# Patient Record
Sex: Female | Born: 1955 | Race: White | Hispanic: No | Marital: Married | Smoking: Former smoker
Health system: Southern US, Community
[De-identification: ages and names within clinical notes are randomized; demographics above are authoritative.]

## PROBLEM LIST (undated history)

## (undated) DIAGNOSIS — A499 Bacterial infection, unspecified: Secondary | ICD-10-CM

## (undated) DIAGNOSIS — F419 Anxiety disorder, unspecified: Secondary | ICD-10-CM

## (undated) DIAGNOSIS — N390 Urinary tract infection, site not specified: Secondary | ICD-10-CM

## (undated) DIAGNOSIS — R002 Palpitations: Secondary | ICD-10-CM

## (undated) DIAGNOSIS — E559 Vitamin D deficiency, unspecified: Secondary | ICD-10-CM

## (undated) DIAGNOSIS — D649 Anemia, unspecified: Secondary | ICD-10-CM

## (undated) DIAGNOSIS — M810 Age-related osteoporosis without current pathological fracture: Secondary | ICD-10-CM

## (undated) DIAGNOSIS — D72819 Decreased white blood cell count, unspecified: Secondary | ICD-10-CM

## (undated) DIAGNOSIS — R748 Abnormal levels of other serum enzymes: Secondary | ICD-10-CM

## (undated) DIAGNOSIS — R1011 Right upper quadrant pain: Secondary | ICD-10-CM

## (undated) DIAGNOSIS — I341 Nonrheumatic mitral (valve) prolapse: Secondary | ICD-10-CM

## (undated) HISTORY — DX: Age-related osteoporosis without current pathological fracture: M81.0

## (undated) HISTORY — DX: Urinary tract infection, site not specified: N39.0

## (undated) HISTORY — DX: Decreased white blood cell count, unspecified: D72.819

## (undated) HISTORY — DX: Anxiety disorder, unspecified: F41.9

## (undated) HISTORY — DX: Palpitations: R00.2

## (undated) HISTORY — DX: Bacterial infection, unspecified: A49.9

## (undated) HISTORY — DX: Abnormal levels of other serum enzymes: R74.8

## (undated) HISTORY — PX: LIVER BIOPSY: SHX301

## (undated) HISTORY — DX: Anemia, unspecified: D64.9

## (undated) HISTORY — DX: Vitamin D deficiency, unspecified: E55.9

## (undated) HISTORY — DX: Right upper quadrant pain: R10.11

## (undated) HISTORY — DX: Nonrheumatic mitral (valve) prolapse: I34.1

---

## 1998-03-14 ENCOUNTER — Other Ambulatory Visit: Admission: RE | Admit: 1998-03-14 | Discharge: 1998-03-14 | Payer: Self-pay | Admitting: Obstetrics and Gynecology

## 1999-05-06 ENCOUNTER — Other Ambulatory Visit: Admission: RE | Admit: 1999-05-06 | Discharge: 1999-05-06 | Payer: Self-pay | Admitting: Obstetrics and Gynecology

## 1999-05-07 ENCOUNTER — Other Ambulatory Visit: Admission: RE | Admit: 1999-05-07 | Discharge: 1999-05-07 | Payer: Self-pay | Admitting: Obstetrics and Gynecology

## 1999-05-07 ENCOUNTER — Encounter (INDEPENDENT_AMBULATORY_CARE_PROVIDER_SITE_OTHER): Payer: Self-pay | Admitting: Specialist

## 2000-09-29 ENCOUNTER — Other Ambulatory Visit: Admission: RE | Admit: 2000-09-29 | Discharge: 2000-09-29 | Payer: Self-pay | Admitting: Obstetrics and Gynecology

## 2000-10-08 ENCOUNTER — Encounter: Admission: RE | Admit: 2000-10-08 | Discharge: 2000-10-08 | Payer: Self-pay | Admitting: Obstetrics and Gynecology

## 2000-10-08 ENCOUNTER — Encounter: Payer: Self-pay | Admitting: Obstetrics and Gynecology

## 2001-10-12 ENCOUNTER — Encounter: Payer: Self-pay | Admitting: Obstetrics and Gynecology

## 2001-10-12 ENCOUNTER — Encounter: Admission: RE | Admit: 2001-10-12 | Discharge: 2001-10-12 | Payer: Self-pay | Admitting: Obstetrics and Gynecology

## 2002-01-24 ENCOUNTER — Ambulatory Visit (HOSPITAL_COMMUNITY): Admission: RE | Admit: 2002-01-24 | Discharge: 2002-01-24 | Payer: Self-pay | Admitting: Obstetrics and Gynecology

## 2002-01-24 ENCOUNTER — Encounter: Payer: Self-pay | Admitting: Obstetrics and Gynecology

## 2002-06-06 ENCOUNTER — Encounter (HOSPITAL_COMMUNITY): Payer: Self-pay | Admitting: Oncology

## 2002-06-06 ENCOUNTER — Ambulatory Visit (HOSPITAL_COMMUNITY): Admission: RE | Admit: 2002-06-06 | Discharge: 2002-06-06 | Payer: Self-pay | Admitting: Obstetrics and Gynecology

## 2002-10-18 ENCOUNTER — Encounter: Payer: Self-pay | Admitting: Obstetrics and Gynecology

## 2002-10-18 ENCOUNTER — Encounter: Admission: RE | Admit: 2002-10-18 | Discharge: 2002-10-18 | Payer: Self-pay | Admitting: Obstetrics and Gynecology

## 2003-10-20 ENCOUNTER — Encounter: Admission: RE | Admit: 2003-10-20 | Discharge: 2003-10-20 | Payer: Self-pay | Admitting: Obstetrics and Gynecology

## 2004-11-15 ENCOUNTER — Encounter: Admission: RE | Admit: 2004-11-15 | Discharge: 2004-11-15 | Payer: Self-pay | Admitting: Obstetrics and Gynecology

## 2006-02-04 ENCOUNTER — Encounter: Admission: RE | Admit: 2006-02-04 | Discharge: 2006-02-04 | Payer: Self-pay | Admitting: Obstetrics and Gynecology

## 2006-09-29 ENCOUNTER — Encounter: Admission: RE | Admit: 2006-09-29 | Discharge: 2006-09-29 | Payer: Self-pay | Admitting: Internal Medicine

## 2007-04-07 ENCOUNTER — Encounter: Admission: RE | Admit: 2007-04-07 | Discharge: 2007-04-07 | Payer: Self-pay | Admitting: Obstetrics and Gynecology

## 2008-03-14 ENCOUNTER — Encounter: Admission: RE | Admit: 2008-03-14 | Discharge: 2008-03-14 | Payer: Self-pay | Admitting: Internal Medicine

## 2008-04-10 ENCOUNTER — Encounter: Admission: RE | Admit: 2008-04-10 | Discharge: 2008-04-10 | Payer: Self-pay | Admitting: Obstetrics and Gynecology

## 2009-04-11 ENCOUNTER — Encounter: Admission: RE | Admit: 2009-04-11 | Discharge: 2009-04-11 | Payer: Self-pay | Admitting: Obstetrics and Gynecology

## 2010-04-22 ENCOUNTER — Encounter: Admission: RE | Admit: 2010-04-22 | Discharge: 2010-04-22 | Payer: Self-pay | Admitting: Obstetrics and Gynecology

## 2010-08-23 ENCOUNTER — Ambulatory Visit (HOSPITAL_COMMUNITY): Admission: RE | Admit: 2010-08-23 | Discharge: 2010-08-23 | Payer: Self-pay | Admitting: Surgery

## 2011-01-23 LAB — CBC
HCT: 37.5 % (ref 36.0–46.0)
Hemoglobin: 13 g/dL (ref 12.0–15.0)
MCH: 31.6 pg (ref 26.0–34.0)
MCHC: 34.8 g/dL (ref 30.0–36.0)
MCV: 90.9 fL (ref 78.0–100.0)
Platelets: 178 10*3/uL (ref 150–400)
RBC: 4.12 MIL/uL (ref 3.87–5.11)
RDW: 12.6 % (ref 11.5–15.5)
WBC: 3.6 10*3/uL — ABNORMAL LOW (ref 4.0–10.5)

## 2011-01-23 LAB — SURGICAL PCR SCREEN
MRSA, PCR: NEGATIVE
Staphylococcus aureus: POSITIVE — AB

## 2011-03-31 ENCOUNTER — Other Ambulatory Visit: Payer: Self-pay | Admitting: Obstetrics and Gynecology

## 2011-03-31 DIAGNOSIS — Z1231 Encounter for screening mammogram for malignant neoplasm of breast: Secondary | ICD-10-CM

## 2011-05-11 HISTORY — PX: CARDIAC CATHETERIZATION: SHX172

## 2011-05-12 ENCOUNTER — Ambulatory Visit: Payer: Self-pay

## 2011-05-20 ENCOUNTER — Ambulatory Visit
Admission: RE | Admit: 2011-05-20 | Discharge: 2011-05-20 | Disposition: A | Discharge status: Home / Self Care | Payer: BC Managed Care – PPO | Source: Ambulatory Visit | Attending: Obstetrics and Gynecology | Admitting: Obstetrics and Gynecology

## 2011-05-20 DIAGNOSIS — Z1231 Encounter for screening mammogram for malignant neoplasm of breast: Secondary | ICD-10-CM

## 2011-06-03 ENCOUNTER — Observation Stay (HOSPITAL_COMMUNITY)
Admission: EM | Admit: 2011-06-03 | Discharge: 2011-06-04 | DRG: 125 | Disposition: A | Discharge status: Home / Self Care | Payer: BC Managed Care – PPO | Attending: Cardiovascular Disease | Admitting: Cardiovascular Disease

## 2011-06-03 ENCOUNTER — Emergency Department (HOSPITAL_COMMUNITY): Payer: BC Managed Care – PPO

## 2011-06-03 DIAGNOSIS — Z8249 Family history of ischemic heart disease and other diseases of the circulatory system: Secondary | ICD-10-CM | POA: Insufficient documentation

## 2011-06-03 DIAGNOSIS — R002 Palpitations: Principal | ICD-10-CM | POA: Insufficient documentation

## 2011-06-03 DIAGNOSIS — R9431 Abnormal electrocardiogram [ECG] [EKG]: Secondary | ICD-10-CM | POA: Insufficient documentation

## 2011-06-03 DIAGNOSIS — R030 Elevated blood-pressure reading, without diagnosis of hypertension: Secondary | ICD-10-CM | POA: Insufficient documentation

## 2011-06-03 DIAGNOSIS — R000 Tachycardia, unspecified: Secondary | ICD-10-CM | POA: Insufficient documentation

## 2011-06-03 LAB — CARDIAC PANEL(CRET KIN+CKTOT+MB+TROPI)
CK, MB: 1.6 ng/mL (ref 0.3–4.0)
CK, MB: 1.4 ng/mL (ref 0.3–4.0)
Relative Index: INVALID (ref 0.0–2.5)
Relative Index: INVALID (ref 0.0–2.5)
Total CK: 81 U/L (ref 7–177)
Total CK: 71 U/L (ref 7–177)
Troponin I: <0.3 ng/mL (ref <0.30)
Troponin I: <0.3 ng/mL (ref <0.30)

## 2011-06-03 LAB — LIPID PANEL
Cholesterol: 191 mg/dL (ref 0–200)
HDL: 81 mg/dL (ref >39)
LDL Cholesterol: 98 mg/dL (ref 0–99)
Total CHOL/HDL Ratio: 2.4 RATIO
Triglycerides: 60 mg/dL (ref <150)
VLDL: 12 mg/dL (ref 0–40)

## 2011-06-03 LAB — DIFFERENTIAL
Basophils Absolute: 0 10*3/uL (ref 0.0–0.1)
Basophils Relative: 1 % (ref 0–1)
Eosinophils Absolute: 0.1 10*3/uL (ref 0.0–0.7)
Eosinophils Relative: 3 % (ref 0–5)
Lymphocytes Relative: 33 % (ref 12–46)
Lymphs Abs: 1.8 10*3/uL (ref 0.7–4.0)
Monocytes Absolute: 0.6 10*3/uL (ref 0.1–1.0)
Monocytes Relative: 12 % (ref 3–12)
Neutro Abs: 2.8 10*3/uL (ref 1.7–7.7)
Neutrophils Relative %: 52 % (ref 43–77)

## 2011-06-03 LAB — POCT I-STAT, CHEM 8
BUN: 18 mg/dL (ref 6–23)
Calcium, Ion: 1.14 mmol/L (ref 1.12–1.32)
Chloride: 109 mEq/L (ref 96–112)
Creatinine, Ser: 0.7 mg/dL (ref 0.50–1.10)
Glucose, Bld: 105 mg/dL — ABNORMAL HIGH (ref 70–99)
HCT: 39 % (ref 36.0–46.0)
Hemoglobin: 13.3 g/dL (ref 12.0–15.0)
Potassium: 3.5 mEq/L (ref 3.5–5.1)
Sodium: 143 mEq/L (ref 135–145)
TCO2: 24 mmol/L (ref 0–100)

## 2011-06-03 LAB — CBC
HCT: 38.2 % (ref 36.0–46.0)
Hemoglobin: 13.1 g/dL (ref 12.0–15.0)
MCH: 30.8 pg (ref 26.0–34.0)
MCHC: 34.3 g/dL (ref 30.0–36.0)
MCV: 89.9 fL (ref 78.0–100.0)
Platelets: 155 10*3/uL (ref 150–400)
RBC: 4.25 MIL/uL (ref 3.87–5.11)
RDW: 12.4 % (ref 11.5–15.5)
WBC: 5.4 10*3/uL (ref 4.0–10.5)

## 2011-06-03 LAB — HEMOGLOBIN A1C
Hgb A1c MFr Bld: 5.3 % (ref <5.7)
Mean Plasma Glucose: 105 mg/dL (ref <117)

## 2011-06-03 LAB — MAGNESIUM: Magnesium: 2.2 mg/dL (ref 1.5–2.5)

## 2011-06-03 LAB — TSH: TSH: 3.5 u[IU]/mL (ref 0.350–4.500)

## 2011-06-03 LAB — TROPONIN I: Troponin I: <0.3 ng/mL (ref <0.30)

## 2011-06-04 LAB — CBC
HCT: 36.8 % (ref 36.0–46.0)
Hemoglobin: 12.3 g/dL (ref 12.0–15.0)
MCH: 30.3 pg (ref 26.0–34.0)
MCHC: 33.4 g/dL (ref 30.0–36.0)
MCV: 90.6 fL (ref 78.0–100.0)
Platelets: 169 10*3/uL (ref 150–400)
RBC: 4.06 MIL/uL (ref 3.87–5.11)
RDW: 12.5 % (ref 11.5–15.5)
WBC: 4.8 10*3/uL (ref 4.0–10.5)

## 2011-06-04 LAB — BASIC METABOLIC PANEL
BUN: 13 mg/dL (ref 6–23)
CO2: 25 mEq/L (ref 19–32)
Calcium: 9.4 mg/dL (ref 8.4–10.5)
Chloride: 108 mEq/L (ref 96–112)
Creatinine, Ser: 0.57 mg/dL (ref 0.50–1.10)
GFR calc Af Amer: >60 mL/min (ref >60)
GFR calc non Af Amer: >60 mL/min (ref >60)
Glucose, Bld: 97 mg/dL (ref 70–99)
Potassium: 3.8 mEq/L (ref 3.5–5.1)
Sodium: 140 mEq/L (ref 135–145)

## 2011-06-04 NOTE — Cardiovascular Report (Signed)
Linda Noble, RADFORD NO.:  [id number]  MEDICAL RECORD NO.:  [id number]  LOCATION:  2006                         FACILITY:  [facility]  PHYSICIAN:  Thereasa Solo. Perfecto Purdy, M.D. DATE OF BIRTH:  07/17/1956  DATE OF PROCEDURE:  06/03/2011 DATE OF DISCHARGE:                           CARDIAC CATHETERIZATION   PROCEDURE:  Cardiac catheterization.  OPERATOR:  Thereasa Solo. Katriana Dortch, MD  INDICATIONS FOR TEST:  This 55 year old female presented to [facility] emergency room after 2 hours of marked tachycardia that was associated with discomfort in her right ear.  The tachycardia resolved as she reached the emergency room and so did the ear discomfort.  Her cardiac markers were negative but her EKG shows ST abnormalities in the inferior lateral leads that are more prominent than her prior outpatient cardiograms from Dr. Carolee Rota office.  She is brought to the cath lab for cardiac catheterization.  She is currently in a sinus rhythm.  Of note, her blood pressure in the ER was as high as 180 systolic.  She does not have a history of hypertension.  PROCEDURE IN DETAIL:  After obtaining informed consent, the patient was prepped and draped in the usual sterile fashion exposing the right groin.  Following local anesthetic with 1% Xylocaine, the Seldinger technique was employed and a 5-French introducer sheath was placed in the right femoral artery.  Left and right coronary arteriography and ventriculography in the RAO projection was performed.  COMPLICATIONS:  None.  TOTAL CONTRAST USED:  70 mL.  EQUIPMENT:  5-French Judkins configuration catheters.  RESULTS:  Hemodynamic monitoring:  Her central aortic pressure was 129/73 and her left ventricular pressure was 130/0 with no gradient noted at the time of pullback.  The left ventricular end-diastolic pressure was 7.  Her pressure when she first presented to the cath lab was in the 160s, but after a milligram of Versed her  pressure normalized.  Ventriculography:  Ventriculography in the RAO projection using 25 mL of contrast at 12 mL per second showed normal LV systolic function with an ejection fraction excess of 60%.  PVCs were noted during the ventriculogram.  End-diastolic pressure was 7.  Coronary arteriography:  On fluoroscopy there was no calcification. 1. Left main.  Normal and bifurcated. 2. LAD.  The LAD extended down to the apex of the heart and gave rise     to a very large diagonal that paralleled the LAD, almost like a     twin LAD type system.  This system was free of disease. 3. Circumflex.  The circumflex had 3 OM vessels with minimal ostial     narrowing of OM #1 with remainder system being free of disease. 4. Right coronary artery.  The right coronary artery gave rise to a     PDA and a very small posterolateral vessel all of which was free of     disease.  CONCLUSION: 1. Normal left ventricular systolic function. 2. No occlusive coronary disease.  I empirically plan to start her on metoprolol 25 mg p.o. b.i.d., this was more for the tachycardia.  I will monitor her overnight and plan discharge in the morning.  If her palpitations reoccurred, we will need an event  monitor to try to document the ectopy and I will not start the metoprolol until she is ready for discharge.          ______________________________ Thereasa Solo. Keath Matera, M.D.     ABL/MEDQ  D:  06/03/2011  T:  06/03/2011  Job:  [id number]  cc:   Soyla Murphy. Renne Crigler, M.D.  Electronically Signed by Julieanne Manson M.D. on 06/04/2011 01:36:52 PM

## 2011-06-11 NOTE — Discharge Summary (Signed)
Linda Noble, PAT NO.:  [id number]  MEDICAL RECORD NO.:  [id number]  LOCATION:  2006                         FACILITY:  [facility]  PHYSICIAN:  Linda Noble, M.D.     DATE OF BIRTH:  [birth year]  DATE OF ADMISSION:  06/03/2011 DATE OF DISCHARGE:  06/04/2011                              DISCHARGE SUMMARY   DISCHARGE DIAGNOSES: 1. Palpitations, i.e. rapid irregular heart beat, resolved. 2. Abnormal EKG, new changes from old EKG and at this heart     catheterization with normal coronary arteries. 3. Family history of coronary disease with father dying from     myocardial infarction in his 34s. 4. Elevated blood pressure, improved.  DISCHARGE CONDITION:  Improved.  DISCHARGE MEDICATIONS:  See medication reconciliation sheet from [facility]. We only placed her on Lopressor 12.5 mg on a p.r.n. basis for rapid heart rate, otherwise continued her previous home medicines.  DISCHARGE INSTRUCTIONS: 1. 2-D echo is scheduled for June 13, 2011 at noon.  Follow up with     Dr. Tresa Noble on June 16, 2011 at 2:00 p.m. 2. Increase activity slowly.  May shower.  No lifting for 3 days.  No     driving for 2 days. 3. Heart-healthy diet. 4. Wash cath site with soap and water.  Call if any bleeding,     swelling, or drainage.  HISTORY OF PRESENT ILLNESS:  This is a 55 year old white female with no previous cardiac history presented to [facility] Emergency Room on June 03, 2011 with palpitations.  Symptoms began around 11:00 p.m. prior to admission and lasted until arrival in the emergency room at 1:00 a.m. The patient complained of rapid irregular heartbeat associated with right ear pain, right arm weakness.  EKG on arrival revealed sinus rhythm with ST depression inferolaterally.  Her palpitations were resolved by the time she arrived at the ER, but her right arm continued to feel weak.  She had no shortness of breath, nausea, vomiting, or diaphoresis.  She is normally active,  exercises regularly, and her only change in regular activities is some increased fatigue.  Initial blood pressure was normal on arrival, it was elevated at 180 systolic after talking with Cardiology, may have been secondary to stress.  The patient states she has palpitations in the past but nothing lasting as long as her admitting rapid heart rate.  She was seen by Dr. Clarene Noble and the cardiac cath was felt to be the best option at this point.  She underwent heart cath which had normal LV systolic function.  She did have occasional PVCs during procedure.  Coronary arteries were normal. By the next morning, she was stable ambulating without any complications and ready for discharge home with plans for outpatient echo.  Also, Dr. Clarene Noble feels if she has more tachycardia, then he would recommend event monitor.  LABORATORY DATA:  Hemoglobin 12.3, hematocrit 36.8, WBC 4.8, and platelets 169, these were the same as admission.  Chemistry:  Sodium 140, potassium 3.8, chloride 108, CO2 25, glucose 97, BUN 13, creatinine 0.57, calcium 9.4, and magnesium 2.2.  Glycohemoglobin 6.3.  Cardiac enzymes:  CK 71, MB 1.4, and troponin I less than 0.30 x2.  Total  cholesterol 191, triglycerides 60, HDL 81, and LDL 98.  TSH 3.5.  Chest x-ray, two-view:  No acute cardiopulmonary process.  EKG:  Sinus rhythm with ST depression inferolaterally.  Similar pattern is old but much deeper than previous.     Linda Noble. Linda Noble, N.P.   ______________________________ Linda Noble, M.D.    LRI/MEDQ  D:  06/04/2011  T:  06/04/2011  Job:  [id number]  cc:   Linda Noble. Linda Noble, M.D.  Electronically Signed by Linda Noble N.P. on 06/09/2011 03:10:33 PM Electronically Signed by Linda Noble M.D. on 06/11/2011 04:38:50 PM

## 2012-05-11 ENCOUNTER — Other Ambulatory Visit: Payer: Self-pay | Admitting: Obstetrics and Gynecology

## 2012-05-11 DIAGNOSIS — Z1231 Encounter for screening mammogram for malignant neoplasm of breast: Secondary | ICD-10-CM

## 2012-05-21 ENCOUNTER — Ambulatory Visit
Admission: RE | Admit: 2012-05-21 | Discharge: 2012-05-21 | Disposition: A | Discharge status: Home / Self Care | Payer: PRIVATE HEALTH INSURANCE | Source: Ambulatory Visit | Attending: Obstetrics and Gynecology | Admitting: Obstetrics and Gynecology

## 2012-05-21 DIAGNOSIS — Z1231 Encounter for screening mammogram for malignant neoplasm of breast: Secondary | ICD-10-CM

## 2013-04-13 ENCOUNTER — Other Ambulatory Visit: Payer: Self-pay

## 2013-04-13 DIAGNOSIS — Z1231 Encounter for screening mammogram for malignant neoplasm of breast: Secondary | ICD-10-CM

## 2013-05-23 ENCOUNTER — Ambulatory Visit
Admission: RE | Admit: 2013-05-23 | Discharge: 2013-05-23 | Disposition: A | Discharge status: Home / Self Care | Payer: BC Managed Care – PPO | Source: Ambulatory Visit

## 2013-05-23 DIAGNOSIS — Z1231 Encounter for screening mammogram for malignant neoplasm of breast: Secondary | ICD-10-CM

## 2013-07-19 ENCOUNTER — Other Ambulatory Visit: Payer: Self-pay | Admitting: *Deleted

## 2013-07-22 ENCOUNTER — Telehealth: Payer: Self-pay | Admitting: Cardiovascular Disease

## 2013-07-22 NOTE — Telephone Encounter (Signed)
Patient got rx from dr. Tresa Endo or dr. Clarene Duke for metoprolol to take prn.  She has never had to take any and now the rx is expired and pharmacy can't refill and  states patient needs to have [place] call in new rx. She is going on a trip and wants to take some with her as a precaution just in case shse needs it. [facility] on [address].

## 2013-07-22 NOTE — Telephone Encounter (Signed)
Returned call.  Pt informed message received and metoprolol cannot be refilled w/o an OV as she has not been seen since Aug. 2012.  Pt verbalized understanding and agreed w/ plan. Transferred to scheduling.

## 2013-07-27 ENCOUNTER — Ambulatory Visit (INDEPENDENT_AMBULATORY_CARE_PROVIDER_SITE_OTHER): Payer: BC Managed Care – PPO | Admitting: Cardiovascular Disease

## 2013-07-27 ENCOUNTER — Encounter: Payer: Self-pay | Admitting: Cardiovascular Disease

## 2013-07-27 VITALS — BP 112/80 | HR 62 | Ht 68.0 in | Wt 131.0 lb

## 2013-07-27 DIAGNOSIS — Z13220 Encounter for screening for lipoid disorders: Secondary | ICD-10-CM

## 2013-07-27 DIAGNOSIS — I341 Nonrheumatic mitral (valve) prolapse: Secondary | ICD-10-CM

## 2013-07-27 DIAGNOSIS — I059 Rheumatic mitral valve disease, unspecified: Secondary | ICD-10-CM

## 2013-07-27 DIAGNOSIS — R002 Palpitations: Secondary | ICD-10-CM

## 2013-07-27 MED ORDER — METOPROLOL TARTRATE 25 MG PO TABS: 25.0000 mg | ORAL_TABLET | ORAL | Status: DC | PRN

## 2013-07-27 NOTE — Progress Notes (Signed)
Patient ID: Linda Noble, female   DOB: 04-28-1956, 57 y.o.   MRN: [id number]     HPI: Linda Noble, is a 57 y.o. female presents to the office today for cardiology evaluation. I last saw her 2 years ago.  Linda Noble has a history of palpitations for many years which typically have been very short-lived. In July 2012 she experienced a more persistent episode of tachycardia palpitations lasted several hours. After tachycardia resolved she was noted to have T-wave changes V3 through V6 as well as inferiorly. At that time, she was seen by Dr. Clarene Duke and to HER-2 or ECG changes and vague discomfort radiating to her arm and ear cardiac catheterization was performed. This revealed normal systolic function and normal coronary arteries. An echo Doppler study at that time was not definitive for mitral valve prolapse but did show trace MR and trace TR trace AR and trace PR. Ejection fraction was 55%.  She had been given a prescription for metoprolol tartrate to 12 to take 12.5 mg on an as-needed basis. Takes when necessary alprazolam  Past several years she has continued to do well. She denies any restrictions in activity. She has not been bothered by palpitations and essentially has not taken the metoprolol. Recently, there has been some increased work-related stress. She also is planning to [place] for 2 weeks and 2 days. She realizes that her metoprolol prescription was expired. She presents now for evaluation. Over the past several weeks he has noticed slight increase in the palpitations due to this increased recent stress.  No past medical history on file.  No past surgical history on file.  No Known Allergies  Current Outpatient Prescriptions  Medication Sig Dispense Refill  . metoprolol tartrate (LOPRESSOR) 25 MG tablet Take 25 mg by mouth as needed.      Marland Kitchen XANAX 0.25 MG tablet Take 1 tablet by mouth as needed.       No current facility-administered medications for this visit.    History    Social History  . Marital Status: Married    Spouse Name: N/A    Number of Children: N/A  . Years of Education: N/A   Occupational History  . Not on file.   Social History Main Topics  . Smoking status: Former Games developer  . Smokeless tobacco: Never Used     Comment: quit about 20 years ago  . Alcohol Use: 3 - 3.5 oz/week    6-7 drink(s) per week  . Drug Use: Not on file  . Sexual Activity: Not on file   Other Topics Concern  . Not on file   Social History Narrative  . No narrative on file    Family History  Problem Relation Age of Onset  . Heart disease Mother   . Heart attack Father     ROS is negative for fevers, chills or night sweats. She denies visual symptoms. She denies presyncope or syncope. She denies chest pain. She denies wheezing. She denies abdominal pain. She denies change in bowel or bladder habits. She denies claudication. She denies paresthesias. She denies tremors. She is unaware of thyroid abnormalities.  Other system review is negative.  PE BP 112/80  Pulse 62  Ht 5\' 8"  (1.727 m)  Wt 131 lb (59.421 kg)  BMI 19.92 kg/m2  General: Alert, oriented, no distress.  Skin: normal turgor, no rashes HEENT: Normocephalic, atraumatic. Pupils round and reactive; sclera anicteric;no lid lag.  Nose without nasal septal hypertrophy Mouth/Parynx benign; she does have  a high arched palate. Mallinpatti scale 2 Neck: No JVD, no carotid briuts Lungs: clear to ausculatation and percussion; no wheezing or rales Heart: RRR, s1 s2 normal; systolic click with a 2/6 systolic murmur at the lower sternal border but most pronounced pronounced at the apex suggestive of mitral valve prolapse with mitral regurgitation. Abdomen: soft, nontender; no hepatosplenomehaly, BS+; abdominal aorta nontender and not dilated by palpation. Pulses 2+ Extremities: no clubbing cyanosis or edema, Homan's sign negative  Neurologic: grossly nonfocal  ECG: Sinus rhythm at 62 beats per minute.  Grossly noted ST changes inferolaterally but slightly more pronounced T-wave inversion V3 V4 from previously.  LABS:  BMET    Component Value Date/Time   NA 140 06/04/2011 0420   K 3.8 06/04/2011 0420   CL 108 06/04/2011 0420   CO2 25 06/04/2011 0420   GLUCOSE 97 06/04/2011 0420   BUN 13 06/04/2011 0420   CREATININE 0.57 06/04/2011 0420   CALCIUM 9.4 06/04/2011 0420   GFRNONAA >60 06/04/2011 0420   GFRAA >60 06/04/2011 0420     Hepatic Function Panel  No results found for this basename: prot, albumin, ast, alt, alkphos, bilitot, bilidir, ibili     CBC    Component Value Date/Time   WBC 4.8 06/04/2011 0420   RBC 4.06 06/04/2011 0420   HGB 12.3 06/04/2011 0420   HCT 36.8 06/04/2011 0420   PLT 169 06/04/2011 0420   MCV 90.6 06/04/2011 0420   MCH 30.3 06/04/2011 0420   MCHC 33.4 06/04/2011 0420   RDW 12.5 06/04/2011 0420   LYMPHSABS 1.8 06/03/2011 0430   MONOABS 0.6 06/03/2011 0430   EOSABS 0.1 06/03/2011 0430   BASOSABS 0.0 06/03/2011 0430     BNP No results found for this basename: probnp    Lipid Panel     Component Value Date/Time   CHOL 191 06/03/2011 0915   TRIG 60 06/03/2011 0915   HDL 81 06/03/2011 0915   CHOLHDL 2.4 06/03/2011 0915   VLDL 12 06/03/2011 0915   LDLCALC 98 06/03/2011 0915     RADIOLOGY: No results found.    ASSESSMENT AND PLAN: Linda Noble is a 57 year old female who has a history of tachycardia palpitations. She does have nonspecific ST-T changes which have been present in 2012 and perhaps are slightly more prominent. Cardiac catheterization done 2 years ago revealed normal coronary arteries without obstructive disease. She denies any episodes of chest pain. She remains asymptomatic with the exception of recent slight increase in her long-standing palpitation history. She does admit to recent stress and it is possible that this most likely is catecholamine mediated. Physical exam does suggest mitral valve prolapse with mitral regurgitation. I did renew  her metoprolol tartrate to take 12.5-25 mg on a when necessary basis for palpitations if they recur and are significant. Upon her return from [place], I am suggesting that she undergo a two-year followup echo Doppler study. I'm also checking laboratory in a fasting state tomorrow before her trip. I will see her back in the office in approximately 2-3 months in followup of her echo Doppler to reassess her palpitation history. Significant increase palpitations recur monitoring will be undertaken at that time.     Lennette Bihari, MD, [facility]  07/27/2013 9:34 AM

## 2013-07-27 NOTE — Patient Instructions (Addendum)
Your physician has requested that you have an echocardiogram. Echocardiography is a painless test that uses sound waves to create images of your heart. It provides your doctor with information about the size and shape of your heart and how well your heart's chambers and valves are working. This procedure takes approximately one hour. There are no restrictions for this procedure.  Your physician recommends that you return for lab work.  Your physician recommends that you schedule a follow-up appointment in: 2-3 months

## 2013-07-28 LAB — LIPID PANEL
Cholesterol: 191 mg/dL (ref 0–200)
HDL: 72 mg/dL (ref >39)
LDL Cholesterol: 107 mg/dL — ABNORMAL HIGH (ref 0–99)
Total CHOL/HDL Ratio: 2.7 Ratio
Triglycerides: 61 mg/dL (ref <150)
VLDL: 12 mg/dL (ref 0–40)

## 2013-07-28 LAB — CBC
HCT: 40.7 % (ref 36.0–46.0)
Hemoglobin: 13.7 g/dL (ref 12.0–15.0)
MCH: 30.4 pg (ref 26.0–34.0)
MCHC: 33.7 g/dL (ref 30.0–36.0)
MCV: 90.2 fL (ref 78.0–100.0)
Platelets: 188 10*3/uL (ref 150–400)
RBC: 4.51 MIL/uL (ref 3.87–5.11)
RDW: 14 % (ref 11.5–15.5)
WBC: 3.5 10*3/uL — ABNORMAL LOW (ref 4.0–10.5)

## 2013-07-28 LAB — COMPREHENSIVE METABOLIC PANEL
ALT: 28 U/L (ref 0–35)
AST: 29 U/L (ref 0–37)
Albumin: 4.5 g/dL (ref 3.5–5.2)
Alkaline Phosphatase: 162 U/L — ABNORMAL HIGH (ref 39–117)
BUN: 19 mg/dL (ref 6–23)
CO2: 31 mEq/L (ref 19–32)
Calcium: 10.3 mg/dL (ref 8.4–10.5)
Chloride: 107 mEq/L (ref 96–112)
Creat: 0.67 mg/dL (ref 0.50–1.10)
Glucose, Bld: 89 mg/dL (ref 70–99)
Potassium: 4.8 mEq/L (ref 3.5–5.3)
Sodium: 142 mEq/L (ref 135–145)
Total Bilirubin: 0.7 mg/dL (ref 0.3–1.2)
Total Protein: 7.9 g/dL (ref 6.0–8.3)

## 2013-07-28 LAB — MAGNESIUM: Magnesium: 2.1 mg/dL (ref 1.5–2.5)

## 2013-07-28 LAB — TSH: TSH: 4.602 u[IU]/mL — ABNORMAL HIGH (ref 0.350–4.500)

## 2013-08-04 ENCOUNTER — Ambulatory Visit (HOSPITAL_COMMUNITY): Payer: BC Managed Care – PPO

## 2013-08-09 ENCOUNTER — Encounter: Payer: Self-pay | Admitting: *Deleted

## 2013-08-09 NOTE — Progress Notes (Signed)
Quick Note:  Lab note sent to patient. ______

## 2013-08-10 ENCOUNTER — Telehealth: Payer: Self-pay | Admitting: Cardiovascular Disease

## 2013-08-10 NOTE — Telephone Encounter (Signed)
Called patient to inform that Linda Noble, CMA had mailed lab results yesterday - should be expecting in mail. Informed patient over phone about cholesterol levels and electrolytes. Patient verbalized understanding. Patient asked that labs be sent to PCP - labs routed to Dr. Renne Crigler through [facility] fax.

## 2013-08-10 NOTE — Telephone Encounter (Signed)
Wanting lab results

## 2013-08-17 ENCOUNTER — Telehealth: Payer: Self-pay | Admitting: Cardiovascular Disease

## 2013-08-17 NOTE — Telephone Encounter (Signed)
Need her lab results faxed over to Dr Tracey Harries this morning asap.She have been waiting over a week for her results.Fax [phone] appt is today at 11.

## 2013-08-17 NOTE — Telephone Encounter (Signed)
Medical records sent information that was requested. Per Emi Belfast

## 2013-09-05 ENCOUNTER — Ambulatory Visit (HOSPITAL_COMMUNITY)
Admission: RE | Admit: 2013-09-05 | Discharge: 2013-09-05 | Disposition: A | Discharge status: Home / Self Care | Payer: BC Managed Care – PPO | Source: Ambulatory Visit | Attending: Internal Medicine | Admitting: Internal Medicine

## 2013-09-05 ENCOUNTER — Ambulatory Visit (HOSPITAL_COMMUNITY): Payer: BC Managed Care – PPO

## 2013-09-05 DIAGNOSIS — R002 Palpitations: Secondary | ICD-10-CM

## 2013-09-05 NOTE — Progress Notes (Signed)
2D Echo Performed 09/05/2013    Persais Ethridge, RCS  

## 2013-09-15 ENCOUNTER — Other Ambulatory Visit: Payer: Self-pay

## 2013-09-19 NOTE — Progress Notes (Signed)
Quick Note:  Results released into mychart. ______ 

## 2013-10-24 ENCOUNTER — Encounter: Payer: Self-pay | Admitting: Cardiovascular Disease

## 2013-10-24 ENCOUNTER — Ambulatory Visit (INDEPENDENT_AMBULATORY_CARE_PROVIDER_SITE_OTHER): Payer: BC Managed Care – PPO | Admitting: Cardiovascular Disease

## 2013-10-24 VITALS — BP 110/86 | HR 57 | Ht 68.0 in | Wt 131.9 lb

## 2013-10-24 DIAGNOSIS — I059 Rheumatic mitral valve disease, unspecified: Secondary | ICD-10-CM

## 2013-10-24 DIAGNOSIS — R002 Palpitations: Secondary | ICD-10-CM

## 2013-10-24 DIAGNOSIS — I340 Nonrheumatic mitral (valve) insufficiency: Secondary | ICD-10-CM | POA: Insufficient documentation

## 2013-10-24 NOTE — Patient Instructions (Signed)
Your physician recommends that you schedule a follow-up appointment in: 1 YEAR. No changes were made today in your therapy. 

## 2013-10-24 NOTE — Progress Notes (Signed)
Patient ID: [name], female   DOB: 1956/10/22, 57 y.o.   MRN: [id number]      HPI: Linda Noble, is a 57 y.o. female presents to the office today for cardiology evaluation.   Linda Noble has a history of palpitations for many years which typically have been very short-lived. In July 2012 she experienced a more persistent episode of tachypalpitations lasting several hours. After tachycardia resolved she was noted to have T-wave changes V3 through V6 as well as inferiorly. At that time, she was seen by Dr. Clarene Duke and to due to her ECG changes and vague discomfort radiating to her arm and ear cardiac catheterization was performed. This revealed normal systolic function and normal coronary arteries. An echo Doppler study at that time was not definitive for mitral valve prolapse but did show trace MR and trace TR trace AR and trace PR. Ejection fraction was 55%.  She had been given a prescription for metoprolol tartrate to 12 to take 12.5 mg on an as-needed basis and also takes when necessary alprazolam  Since I last saw Linda Noble in September she has continued to do well. She denies any restrictions in activity. She has not been bothered by palpitations and essentially has not taken the metoprolol. Recently, there has been some increased work-related stress.  She recently underwent a two-year followup echo Doppler study which was done on 09/05/2013. This showed an ejection fraction of 65-70%. Her mitral valve was mildly thickened without diagnostic prolapse on this study. She did have mild posteriorly directed mitral regurgitation. There also was mildly thickened tricuspid leaflet with trivial TR.  I also reviewed with her today the laboratory that she had done on 07/28/2013 she did have mild elevation of her alkaline phosphatase. She tells me several years ago she underwent a comprehensive evaluation for this including a liver biopsy at [facility] which was normal.  History reviewed. No  pertinent past medical history.  History reviewed. No pertinent past surgical history.  No Known Allergies  Current Outpatient Prescriptions  Medication Sig Dispense Refill  . metoprolol tartrate (LOPRESSOR) 25 MG tablet Take 1 tablet (25 mg total) by mouth as needed. Take 1/2-1 prn palpitations  30 tablet  3  . XANAX 0.25 MG tablet Take 1 tablet by mouth as needed.       No current facility-administered medications for this visit.    History   Social History  . Marital Status: Married    Spouse Name: N/A    Number of Children: N/A  . Years of Education: N/A   Occupational History  . Not on file.   Social History Main Topics  . Smoking status: Former Games developer  . Smokeless tobacco: Never Used     Comment: quit about 20 years ago  . Alcohol Use: 3 - 3.5 oz/week    6-7 drink(s) per week  . Drug Use: Not on file  . Sexual Activity: Not on file   Other Topics Concern  . Not on file   Social History Narrative  . No narrative on file    Family History  Problem Relation Age of Onset  . Heart disease Mother   . Heart attack Father     ROS is negative for fevers, chills or night sweats. She denies weight change. She denies rash She denies visual symptoms. She denies cough increased sputum production. She denies presyncope or syncope. She denies chest pain. She denies wheezing. She denies abdominal pain. She denies change in bowel or  bladder habits. She denies claudication. She denies paresthesias. She denies tremors. She is unaware of thyroid abnormalities.  She does have some difficulty with insomnia. She's unaware of sleep disordered breathing. Other comprehensive 14 port system review is negative.  PE BP 110/86  Pulse 57  Ht 5\' 8"  (1.727 m)  Wt 131 lb 14.4 oz (59.829 kg)  BMI 20.06 kg/m2  General: Alert, oriented, no distress.  Skin: normal turgor, no rashes HEENT: Normocephalic, atraumatic. Pupils round and reactive; sclera anicteric;no lid lag.  Nose without nasal  septal hypertrophy Mouth/Parynx benign; she does have a high arched palate. Mallinpatti scale 2 Neck: No JVD, no carotid bruitts Lungs: clear to ausculatation and percussion; no wheezing or rales Heart: RRR, s1 s2 normal; systolic click with a 1-2/6 systolic murmur at the lower sternal border but most pronounced pronounced at the apex suggestive of mitral valve prolapse with mitral regurgitation. Abdomen: soft, nontender; no hepatosplenomehaly, BS+; abdominal aorta nontender and not dilated by palpation. Pulses 2+ Extremities: no clubbing cyanosis or edema, Homan's sign negative  Neurologic: grossly nonfocal Psychological: Normal affect and mood.  ECG: Sinus rhythm at 57 beats per minute. Previously noted mild inferolateral ST-T changes. LABS:  BMET    Component Value Date/Time   NA 142 07/28/2013 0844   K 4.8 07/28/2013 0844   CL 107 07/28/2013 0844   CO2 31 07/28/2013 0844   GLUCOSE 89 07/28/2013 0844   BUN 19 07/28/2013 0844   CREATININE 0.67 07/28/2013 0844   CREATININE 0.57 06/04/2011 0420   CALCIUM 10.3 07/28/2013 0844   GFRNONAA >60 06/04/2011 0420   GFRAA >60 06/04/2011 0420     Hepatic Function Panel     Component Value Date/Time   PROT 7.9 07/28/2013 0844     CBC    Component Value Date/Time   WBC 3.5* 07/28/2013 0844   RBC 4.51 07/28/2013 0844   HGB 13.7 07/28/2013 0844   HCT 40.7 07/28/2013 0844   PLT 188 07/28/2013 0844   MCV 90.2 07/28/2013 0844   MCH 30.4 07/28/2013 0844   MCHC 33.7 07/28/2013 0844   RDW 14.0 07/28/2013 0844   LYMPHSABS 1.8 06/03/2011 0430   MONOABS 0.6 06/03/2011 0430   EOSABS 0.1 06/03/2011 0430   BASOSABS 0.0 06/03/2011 0430     BNP No results found for this basename: probnp    Lipid Panel     Component Value Date/Time   CHOL 191 07/28/2013 0844   TRIG 61 07/28/2013 0844   HDL 72 07/28/2013 0844   CHOLHDL 2.7 07/28/2013 0844   VLDL 12 07/28/2013 0844   LDLCALC 107* 07/28/2013 0844     RADIOLOGY: No results found.    ASSESSMENT AND  PLAN: Linda Noble is a 57 year old female who has a history of tachycardia palpitations and has not required use of metoprolol.  She does have nonspecific ST-T changes which have been present in 2012. Previous cardiac catheterization showed normal coronary arteries. Her most recent echo Doppler study confirms vigorous LV contractility. She did have mildly thickened valve and diagnostic prolapse was not demonstrated on this most recent echo. She did have a posteriorly directed jet of mitral regurgitation is suggestive of possible anterior leaflet prolapse which was not clearly discerned. She has not required use of her metoprolol. Negative for palpitations or stress mediated. Her blood pressure today is well controlled. Laboratory was reviewed. She is remaining stable. I will see her in one year for cardiology reassessment.   Lennette Bihari, MD, [facility]  10/24/2013 9:58  AM    

## 2014-05-08 ENCOUNTER — Other Ambulatory Visit: Payer: Self-pay

## 2014-05-08 DIAGNOSIS — Z1231 Encounter for screening mammogram for malignant neoplasm of breast: Secondary | ICD-10-CM

## 2014-05-31 ENCOUNTER — Ambulatory Visit
Admission: RE | Admit: 2014-05-31 | Discharge: 2014-05-31 | Disposition: A | Discharge status: Home / Self Care | Payer: Managed Care, Other (non HMO) | Source: Ambulatory Visit

## 2014-05-31 DIAGNOSIS — Z1231 Encounter for screening mammogram for malignant neoplasm of breast: Secondary | ICD-10-CM

## 2014-08-25 ENCOUNTER — Other Ambulatory Visit: Payer: Self-pay

## 2014-11-16 ENCOUNTER — Encounter: Payer: Self-pay | Admitting: Internal Medicine

## 2014-11-24 HISTORY — PX: OTHER SURGICAL HISTORY: SHX169

## 2015-01-19 ENCOUNTER — Encounter: Payer: Self-pay | Admitting: Internal Medicine

## 2015-01-19 ENCOUNTER — Ambulatory Visit (INDEPENDENT_AMBULATORY_CARE_PROVIDER_SITE_OTHER): Payer: Managed Care, Other (non HMO) | Admitting: Internal Medicine

## 2015-01-19 VITALS — BP 120/70 | HR 60 | Ht 67.75 in | Wt 131.5 lb

## 2015-01-19 DIAGNOSIS — R7989 Other specified abnormal findings of blood chemistry: Secondary | ICD-10-CM

## 2015-01-19 DIAGNOSIS — R945 Abnormal results of liver function studies: Secondary | ICD-10-CM

## 2015-01-19 DIAGNOSIS — Z1211 Encounter for screening for malignant neoplasm of colon: Secondary | ICD-10-CM

## 2015-01-19 DIAGNOSIS — R1011 Right upper quadrant pain: Secondary | ICD-10-CM

## 2015-01-19 MED ORDER — ALPRAZOLAM 0.25 MG PO TABS: 0.2500 mg | ORAL_TABLET | ORAL | Status: DC | PRN

## 2015-01-19 MED ORDER — MOVIPREP 100 G PO SOLR: 1.0000 | Freq: Once | ORAL | Status: DC

## 2015-01-19 NOTE — Progress Notes (Signed)
Linda Noble January 04, 1956 [id number]  Note: This dictation was prepared with Dragon digital system. Any transcriptional errors that result from this procedure are unintentional.   History of Present Illness: Referred by Dr Renne Crigler This is a 59 year old white female with chronic intermittent right upper quadrant abdominal pain which has bothered her for 15 or 20 years. It has become worse over Christmas but it has eased up in the last several weeks. The pain is exacerbated by change of position or in persistent sitting position at the computer and also at night by turning on her right side. It's not related to meals or bowel habits. No associated symptoms of nausea, vomiting, fever or jaundice. There is a history of mild elevation of alkaline phosphatase which was evaluatedat at  [facility] and included liver biopsy. No specific diagnosis was made. Last upper abdominal ultrasound  in 2009 showed normal 3.7 mm common bile duct, normal liver and gallbladder. Colonoscopy more than 10 years ago. There is no family history of colon cancer. Her bowel habits are regular. She had negative Hemoccults by Dr. Ambrose Mantle last fall.    Past Medical History  Diagnosis Date  . Anxiety   . Heart palpitations   . Mitral valve prolapse   . Anemia   . Urinary tract bacterial infections     Pt had in their 30's    Past Surgical History  Procedure Laterality Date  . Cardiac catheterization  July 2012  . Liver biopsy    . Eyelid surgery Bilateral 11/24/14    Eyelid was droopy    No Known Allergies  Family history and social history have been reviewed.  Review of Systems: Right upper quadrant discomfort. Negative for weight loss fever nausea or vomiting  The remainder of the 10 point ROS is negative except as outlined in the H&P  Physical Exam: General Appearance thin, in no distress, appearing younger than her stated age Eyes  Non icteric  HEENT  Non traumatic, normocephalic  Mouth No lesion,  tongue papillated, no cheilosis Neck Supple without adenopathy, thyroid not enlarged, no carotid bruits, no JVD Lungs Clear to auscultation bilaterally COR Normal S1, normal S2, regular rhythm, no murmur, quiet precordium Abdomen scaphoid. Nontender. Liver edge at costal margin. Ribs are not tender to palpation, pounding over the liver does not cause pain. Sitting up and laying down does not cause  pain. Laying on her right side does cause pain after about 1 minute. There is  no hernia. No bruit. I cannot feel any mass in her colon. No CVA tenderness Rectal not done Extremities  No pedal edema Skin No lesions Neurological Alert and oriented x 3 Psychological Normal mood and affect  Assessment and Plan:   59 year old white female with chronic intermittent right costal margin discomfort previously evaluated thought to be attributed to liver abnormalities but no liver pathology was found.. The nature of the discomfort is characteristic of musculoskeletal pain in that it is worse by changing position or sitting up and may be a result of the crowding or pressure of her viscera on the lower ribs. It also could be due to adhesions on the liver surface which would be hard to diagnose without laparoscopic exam. I cannot reproduce the pain on today's exam except when she lays on her right side. Pain may be associated with intermittent costochondritis. Trial of a nerve block at a intercoatal nerve would be a consideration.  Abnormal liver function tests previously evaluated with liver biopsy. I will obtain results of  the blood tests from Dr.Pharr, and and see if she had  gamma GGT to parrallel  elevation of alkaline phosphatase .  Colorectal screening. Last colonoscopy more than 10 years ago. We will schedule screening colonoscopy. Rule out redundant colon causing pressure on the rib cage   DR Lutricia FeilWalter Pharr  Linda Noble 01/19/2015

## 2015-01-19 NOTE — Patient Instructions (Addendum)
You have been scheduled for a colonoscopy. Please follow written instructions given to you at your visit today.  Please pick up your prep supplies at the pharmacy within the next 1-3 days. If you use inhalers (even only as needed), please bring them with you on the day of your procedure. Your physician has requested that you go to www.startemmi.com and enter the access code given to you at your visit today. This web site gives a general overview about your procedure. However, you should still follow specific instructions given to you by our office regarding your preparation for the procedure.   We have sent the following medications to your pharmacy for you to pick up at your convenience:  Xanax Dr Renne CriglerPharr

## 2015-01-31 ENCOUNTER — Encounter: Payer: Managed Care, Other (non HMO) | Admitting: Internal Medicine

## 2015-02-07 ENCOUNTER — Encounter: Payer: Managed Care, Other (non HMO) | Admitting: Internal Medicine

## 2015-04-18 ENCOUNTER — Encounter: Payer: Self-pay | Admitting: Internal Medicine

## 2015-04-18 ENCOUNTER — Ambulatory Visit (AMBULATORY_SURGERY_CENTER): Payer: Managed Care, Other (non HMO) | Admitting: Internal Medicine

## 2015-04-18 VITALS — BP 115/68 | HR 52 | Temp 98.6°F | Resp 18 | Ht 67.75 in | Wt 131.0 lb

## 2015-04-18 DIAGNOSIS — Z1211 Encounter for screening for malignant neoplasm of colon: Secondary | ICD-10-CM | POA: Diagnosis present

## 2015-04-18 MED ORDER — SODIUM CHLORIDE 0.9 % IV SOLN: 500.0000 mL | INTRAVENOUS | Status: DC

## 2015-04-18 NOTE — Progress Notes (Signed)
Pt stated that last colonoscopy wasn't completed d/t "twisty colon", had to have a barium enema.  Dr. Juanda ChanceBrodie made aware of this per A Mirts RN

## 2015-04-18 NOTE — Progress Notes (Signed)
A/ox3, pleased with MAC, report to RN 

## 2015-04-18 NOTE — Patient Instructions (Signed)
YOU HAD AN ENDOSCOPIC PROCEDURE TODAY AT THE [facility]:   Refer to the procedure report that was given to you for any specific questions about what was found during the examination.  If the procedure report does not answer your questions, please call your gastroenterologist to clarify.  If you requested that your care partner not be given the details of your procedure findings, then the procedure report has been included in a sealed envelope for you to review at your convenience later.  YOU SHOULD EXPECT: Some feelings of bloating in the abdomen. Passage of more gas than usual.  Walking can help get rid of the air that was put into your GI tract during the procedure and reduce the bloating. If you had a lower endoscopy (such as a colonoscopy or flexible sigmoidoscopy) you may notice spotting of blood in your stool or on the toilet paper. If you underwent a bowel prep for your procedure, you may not have a normal bowel movement for a few days.  Please Note:  You might notice some irritation and congestion in your nose or some drainage.  This is from the oxygen used during your procedure.  There is no need for concern and it should clear up in a day or so.  SYMPTOMS TO REPORT IMMEDIATELY:   Following lower endoscopy (colonoscopy or flexible sigmoidoscopy):  Excessive amounts of blood in the stool  Significant tenderness or worsening of abdominal pains  Swelling of the abdomen that is new, acute  Fever of 100F or higher  For urgent or emergent issues, a gastroenterologist can be reached at any hour by calling [phone].   DIET: Your first meal following the procedure should be a small meal and then it is ok to progress to your normal diet. Heavy or fried foods are harder to digest and may make you feel nauseous or bloated.  Likewise, meals heavy in dairy and vegetables can increase bloating.  Drink plenty of fluids but you should avoid alcoholic beverages for 24  hours.  ACTIVITY:  You should plan to take it easy for the rest of today and you should NOT DRIVE or use heavy machinery until tomorrow (because of the sedation medicines used during the test).    FOLLOW UP: Our staff will call the number listed on your records the next business day following your procedure to check on you and address any questions or concerns that you may have regarding the information given to you following your procedure. If we do not reach you, we will leave a message.  However, if you are feeling well and you are not experiencing any problems, there is no need to return our call.  We will assume that you have returned to your regular daily activities without incident.  If any biopsies were taken you will be contacted by phone or by letter within the next 1-3 weeks.  Please call us at [phone] if you have not heard about the biopsies in 3 weeks.    SIGNATURES/CONFIDENTIALITY: You and/or your care partner have signed paperwork which will be entered into your electronic medical record.  These signatures attest to the fact that that the information above on your After Visit Summary has been reviewed and is understood.  Full responsibility of the confidentiality of this discharge information lies with you and/or your care-partner.  Please review diverticulosis and high fiber diet handouts provided. Next colonoscopy in 10 years. 

## 2015-04-18 NOTE — Op Note (Signed)
[facility] [address] [place], [id number]   COLONOSCOPY PROCEDURE REPORT  PATIENT: Linda MiloMoore, Saidah E  MR#: [id number] BIRTHDATE: [birth year] , 58  yrs. old GENDER: female ENDOSCOPIST: Hart Carwinora M Daimion Adamcik, MD REFERRED XB:JYNWGNBY:Walter Terri Piedra Pharr, M.D. PROCEDURE DATE:  04/18/2015 PROCEDURE:   Colonoscopy, screening First Screening Colonoscopy - Avg.  risk and is 50 yrs.  old or older - No.  Prior Negative Screening - Now for repeat screening. 10 or more years since last screening  History of Adenoma - Now for follow-up colonoscopy & has been > or = to 3 yrs.  N/A  Polyps removed today? No Recommend repeat exam, <10 yrs? No ASA CLASS:   Class II INDICATIONS:Screening for colonic neoplasia, Colorectal Neoplasm Risk Assessment for this procedure is average risk, and prior colonoscopy elsewhere more than 10 years ago was incomplete. Patient had to have a barium enema, she has been having chronic right upper quadrant abdominal pain. MEDICATIONS: Monitored anesthesia care and Propofol 340 mg IV  DESCRIPTION OF PROCEDURE:   After the risks benefits and alternatives of the procedure were thoroughly explained, informed consent was obtained.  The digital rectal exam revealed no abnormalities of the rectum.   The LB PFC-H190 [id number]  endoscope was introduced through the anus and advanced to the cecum, which was identified by both the appendix and ileocecal valve. No adverse events experienced.   The quality of the prep was excellent. (MoviPrep was used)  The instrument was then slowly withdrawn as the colon was fully examined. Estimated blood loss is zero unless otherwise noted in this procedure report.      COLON FINDINGS: There was mild diverticulosis noted throughout the entire examined colon with associated tortuosity and angulation. Retroflexed views revealed no abnormalities. The time to cecum = 7.11 Withdrawal time = 5.37   The scope was withdrawn and the procedure  completed. COMPLICATIONS: There were no immediate complications.  ENDOSCOPIC IMPRESSION: There was mild diverticulosis noted throughout the entire examined colon  RECOMMENDATIONS: High-fiber diet Nothing to explain right upper quadrant abdominal pain recall colonoscopy in 10 years eSigned:  Hart Carwinora M Kleo Dungee, MD 04/18/2015 11:12 AM   cc:   PATIENT NAME:  Linda MiloMoore, Elowyn E MR#: [id number]

## 2015-04-19 ENCOUNTER — Telehealth: Payer: Self-pay | Admitting: *Deleted

## 2015-04-19 NOTE — Telephone Encounter (Signed)
  Follow up Call-  Call back number 04/18/2015  Post procedure Call Back phone  # [phone]  Permission to leave phone message Yes     Patient questions:  Do you have a fever, pain , or abdominal swelling? No. Pain Score  0 *  Have you tolerated food without any problems? Yes.    Have you been able to return to your normal activities? Yes.    Do you have any questions about your discharge instructions: Diet   No. Medications  No. Follow up visit  No.  Do you have questions or concerns about your Care? No.  Actions: * If pain score is 4 or above: No action needed, pain <4.

## 2015-05-09 ENCOUNTER — Other Ambulatory Visit: Payer: Self-pay

## 2015-05-09 DIAGNOSIS — Z1231 Encounter for screening mammogram for malignant neoplasm of breast: Secondary | ICD-10-CM

## 2015-06-12 ENCOUNTER — Encounter: Payer: Self-pay | Admitting: Cardiovascular Disease

## 2015-06-21 ENCOUNTER — Ambulatory Visit
Admission: RE | Admit: 2015-06-21 | Discharge: 2015-06-21 | Disposition: A | Discharge status: Home / Self Care | Payer: 59 | Source: Ambulatory Visit

## 2015-06-21 DIAGNOSIS — Z1231 Encounter for screening mammogram for malignant neoplasm of breast: Secondary | ICD-10-CM

## 2015-08-20 ENCOUNTER — Ambulatory Visit (INDEPENDENT_AMBULATORY_CARE_PROVIDER_SITE_OTHER): Payer: 59 | Admitting: Licensed Clinical Social Worker

## 2015-08-20 DIAGNOSIS — F4322 Adjustment disorder with anxiety: Secondary | ICD-10-CM | POA: Diagnosis not present

## 2015-08-22 ENCOUNTER — Ambulatory Visit: Payer: 59 | Admitting: Licensed Clinical Social Worker

## 2015-08-29 ENCOUNTER — Ambulatory Visit (INDEPENDENT_AMBULATORY_CARE_PROVIDER_SITE_OTHER): Payer: 59 | Admitting: Licensed Clinical Social Worker

## 2015-08-29 DIAGNOSIS — F4322 Adjustment disorder with anxiety: Secondary | ICD-10-CM | POA: Diagnosis not present

## 2015-09-03 ENCOUNTER — Ambulatory Visit (INDEPENDENT_AMBULATORY_CARE_PROVIDER_SITE_OTHER): Payer: 59 | Admitting: Licensed Clinical Social Worker

## 2015-09-03 DIAGNOSIS — F4322 Adjustment disorder with anxiety: Secondary | ICD-10-CM | POA: Diagnosis not present

## 2015-09-10 ENCOUNTER — Ambulatory Visit (INDEPENDENT_AMBULATORY_CARE_PROVIDER_SITE_OTHER): Payer: 59 | Admitting: Licensed Clinical Social Worker

## 2015-09-10 DIAGNOSIS — F4322 Adjustment disorder with anxiety: Secondary | ICD-10-CM

## 2015-09-20 ENCOUNTER — Ambulatory Visit (INDEPENDENT_AMBULATORY_CARE_PROVIDER_SITE_OTHER): Payer: 59 | Admitting: Licensed Clinical Social Worker

## 2015-09-20 ENCOUNTER — Ambulatory Visit: Payer: 59 | Admitting: Licensed Clinical Social Worker

## 2015-09-20 DIAGNOSIS — F4322 Adjustment disorder with anxiety: Secondary | ICD-10-CM | POA: Diagnosis not present

## 2015-09-26 ENCOUNTER — Ambulatory Visit (INDEPENDENT_AMBULATORY_CARE_PROVIDER_SITE_OTHER): Payer: 59 | Admitting: Licensed Clinical Social Worker

## 2015-09-26 DIAGNOSIS — F4322 Adjustment disorder with anxiety: Secondary | ICD-10-CM | POA: Diagnosis not present

## 2015-10-03 ENCOUNTER — Ambulatory Visit: Payer: 59 | Admitting: Licensed Clinical Social Worker

## 2015-10-09 ENCOUNTER — Ambulatory Visit (INDEPENDENT_AMBULATORY_CARE_PROVIDER_SITE_OTHER): Payer: 59 | Admitting: Licensed Clinical Social Worker

## 2015-10-09 DIAGNOSIS — F4322 Adjustment disorder with anxiety: Secondary | ICD-10-CM

## 2015-10-29 ENCOUNTER — Ambulatory Visit (INDEPENDENT_AMBULATORY_CARE_PROVIDER_SITE_OTHER): Payer: 59 | Admitting: Licensed Clinical Social Worker

## 2015-10-29 DIAGNOSIS — F4322 Adjustment disorder with anxiety: Secondary | ICD-10-CM

## 2015-11-14 ENCOUNTER — Ambulatory Visit: Payer: 59 | Admitting: Licensed Clinical Social Worker

## 2016-08-01 ENCOUNTER — Other Ambulatory Visit: Payer: Self-pay | Admitting: Obstetrics and Gynecology

## 2016-08-01 DIAGNOSIS — Z1231 Encounter for screening mammogram for malignant neoplasm of breast: Secondary | ICD-10-CM

## 2016-08-28 ENCOUNTER — Ambulatory Visit
Admission: RE | Admit: 2016-08-28 | Discharge: 2016-08-28 | Disposition: A | Discharge status: Home / Self Care | Payer: Managed Care, Other (non HMO) | Source: Ambulatory Visit | Attending: Obstetrics and Gynecology | Admitting: Obstetrics and Gynecology

## 2016-08-28 DIAGNOSIS — Z1231 Encounter for screening mammogram for malignant neoplasm of breast: Secondary | ICD-10-CM

## 2017-02-10 ENCOUNTER — Other Ambulatory Visit: Payer: Self-pay | Admitting: Internal Medicine

## 2017-02-10 DIAGNOSIS — E079 Disorder of thyroid, unspecified: Secondary | ICD-10-CM

## 2017-02-16 ENCOUNTER — Telehealth: Payer: Self-pay | Admitting: Neurology

## 2017-02-19 ENCOUNTER — Ambulatory Visit
Admission: RE | Admit: 2017-02-19 | Discharge: 2017-02-19 | Disposition: A | Discharge status: Home / Self Care | Payer: Managed Care, Other (non HMO) | Source: Ambulatory Visit | Attending: Internal Medicine | Admitting: Internal Medicine

## 2017-02-19 DIAGNOSIS — E079 Disorder of thyroid, unspecified: Secondary | ICD-10-CM

## 2017-03-23 DIAGNOSIS — E039 Hypothyroidism, unspecified: Secondary | ICD-10-CM | POA: Diagnosis not present

## 2017-03-23 DIAGNOSIS — D72819 Decreased white blood cell count, unspecified: Secondary | ICD-10-CM | POA: Diagnosis not present

## 2017-03-23 NOTE — Telephone Encounter (Signed)
na

## 2017-03-26 DIAGNOSIS — F5104 Psychophysiologic insomnia: Secondary | ICD-10-CM | POA: Diagnosis not present

## 2017-03-26 DIAGNOSIS — E079 Disorder of thyroid, unspecified: Secondary | ICD-10-CM | POA: Diagnosis not present

## 2017-03-26 DIAGNOSIS — R748 Abnormal levels of other serum enzymes: Secondary | ICD-10-CM | POA: Diagnosis not present

## 2017-04-14 DIAGNOSIS — L570 Actinic keratosis: Secondary | ICD-10-CM | POA: Diagnosis not present

## 2017-04-14 DIAGNOSIS — D1801 Hemangioma of skin and subcutaneous tissue: Secondary | ICD-10-CM | POA: Diagnosis not present

## 2017-04-14 DIAGNOSIS — L820 Inflamed seborrheic keratosis: Secondary | ICD-10-CM | POA: Diagnosis not present

## 2017-04-14 DIAGNOSIS — L821 Other seborrheic keratosis: Secondary | ICD-10-CM | POA: Diagnosis not present

## 2017-04-14 DIAGNOSIS — L814 Other melanin hyperpigmentation: Secondary | ICD-10-CM | POA: Diagnosis not present

## 2017-05-05 DIAGNOSIS — H1031 Unspecified acute conjunctivitis, right eye: Secondary | ICD-10-CM | POA: Diagnosis not present

## 2017-07-27 ENCOUNTER — Other Ambulatory Visit: Payer: Self-pay | Admitting: Obstetrics and Gynecology

## 2017-07-27 DIAGNOSIS — Z1231 Encounter for screening mammogram for malignant neoplasm of breast: Secondary | ICD-10-CM

## 2017-09-07 ENCOUNTER — Ambulatory Visit: Payer: Self-pay

## 2017-09-22 ENCOUNTER — Ambulatory Visit
Admission: RE | Admit: 2017-09-22 | Discharge: 2017-09-22 | Disposition: A | Discharge status: Home / Self Care | Payer: BLUE CROSS/BLUE SHIELD | Source: Ambulatory Visit | Attending: Obstetrics and Gynecology | Admitting: Obstetrics and Gynecology

## 2017-09-22 DIAGNOSIS — Z1231 Encounter for screening mammogram for malignant neoplasm of breast: Secondary | ICD-10-CM | POA: Diagnosis not present

## 2017-11-13 DIAGNOSIS — H1033 Unspecified acute conjunctivitis, bilateral: Secondary | ICD-10-CM | POA: Diagnosis not present

## 2017-11-16 DIAGNOSIS — H1033 Unspecified acute conjunctivitis, bilateral: Secondary | ICD-10-CM | POA: Diagnosis not present

## 2017-11-23 DIAGNOSIS — Z79890 Hormone replacement therapy (postmenopausal): Secondary | ICD-10-CM | POA: Diagnosis not present

## 2017-11-23 DIAGNOSIS — Z1389 Encounter for screening for other disorder: Secondary | ICD-10-CM | POA: Diagnosis not present

## 2017-11-23 DIAGNOSIS — Z130 Encounter for screening for diseases of the blood and blood-forming organs and certain disorders involving the immune mechanism: Secondary | ICD-10-CM | POA: Diagnosis not present

## 2017-11-23 DIAGNOSIS — Z6821 Body mass index (BMI) 21.0-21.9, adult: Secondary | ICD-10-CM | POA: Diagnosis not present

## 2017-11-23 DIAGNOSIS — Z01419 Encounter for gynecological examination (general) (routine) without abnormal findings: Secondary | ICD-10-CM | POA: Diagnosis not present

## 2017-12-04 DIAGNOSIS — H2513 Age-related nuclear cataract, bilateral: Secondary | ICD-10-CM | POA: Diagnosis not present

## 2017-12-04 DIAGNOSIS — H5203 Hypermetropia, bilateral: Secondary | ICD-10-CM | POA: Diagnosis not present

## 2017-12-04 DIAGNOSIS — H04123 Dry eye syndrome of bilateral lacrimal glands: Secondary | ICD-10-CM | POA: Diagnosis not present

## 2017-12-04 DIAGNOSIS — H524 Presbyopia: Secondary | ICD-10-CM | POA: Diagnosis not present

## 2017-12-14 DIAGNOSIS — J019 Acute sinusitis, unspecified: Secondary | ICD-10-CM | POA: Diagnosis not present

## 2018-02-15 DIAGNOSIS — E039 Hypothyroidism, unspecified: Secondary | ICD-10-CM | POA: Diagnosis not present

## 2018-02-15 DIAGNOSIS — Z0000 Encounter for general adult medical examination without abnormal findings: Secondary | ICD-10-CM | POA: Diagnosis not present

## 2018-02-15 DIAGNOSIS — N390 Urinary tract infection, site not specified: Secondary | ICD-10-CM | POA: Diagnosis not present

## 2018-02-15 DIAGNOSIS — E559 Vitamin D deficiency, unspecified: Secondary | ICD-10-CM | POA: Diagnosis not present

## 2018-02-18 DIAGNOSIS — Z0001 Encounter for general adult medical examination with abnormal findings: Secondary | ICD-10-CM | POA: Diagnosis not present

## 2018-02-18 DIAGNOSIS — M810 Age-related osteoporosis without current pathological fracture: Secondary | ICD-10-CM | POA: Diagnosis not present

## 2018-02-18 DIAGNOSIS — F5104 Psychophysiologic insomnia: Secondary | ICD-10-CM | POA: Diagnosis not present

## 2018-02-18 DIAGNOSIS — F411 Generalized anxiety disorder: Secondary | ICD-10-CM | POA: Diagnosis not present

## 2018-02-18 DIAGNOSIS — M2550 Pain in unspecified joint: Secondary | ICD-10-CM | POA: Diagnosis not present

## 2018-02-25 DIAGNOSIS — R1011 Right upper quadrant pain: Secondary | ICD-10-CM | POA: Diagnosis not present

## 2018-03-17 DIAGNOSIS — G2581 Restless legs syndrome: Secondary | ICD-10-CM | POA: Diagnosis not present

## 2018-03-17 DIAGNOSIS — E039 Hypothyroidism, unspecified: Secondary | ICD-10-CM | POA: Diagnosis not present

## 2018-03-17 DIAGNOSIS — F411 Generalized anxiety disorder: Secondary | ICD-10-CM | POA: Diagnosis not present

## 2018-03-17 DIAGNOSIS — F5104 Psychophysiologic insomnia: Secondary | ICD-10-CM | POA: Diagnosis not present

## 2018-04-15 DIAGNOSIS — L821 Other seborrheic keratosis: Secondary | ICD-10-CM | POA: Diagnosis not present

## 2018-04-15 DIAGNOSIS — D1801 Hemangioma of skin and subcutaneous tissue: Secondary | ICD-10-CM | POA: Diagnosis not present

## 2018-04-15 DIAGNOSIS — L814 Other melanin hyperpigmentation: Secondary | ICD-10-CM | POA: Diagnosis not present

## 2018-04-15 DIAGNOSIS — D225 Melanocytic nevi of trunk: Secondary | ICD-10-CM | POA: Diagnosis not present

## 2018-05-11 ENCOUNTER — Institutional Professional Consult (permissible substitution): Payer: Self-pay | Admitting: Neurology

## 2018-05-31 ENCOUNTER — Encounter: Payer: Self-pay | Admitting: Neurology

## 2018-05-31 ENCOUNTER — Ambulatory Visit: Payer: BLUE CROSS/BLUE SHIELD | Admitting: Neurology

## 2018-05-31 VITALS — BP 142/86 | HR 56 | Ht 68.0 in | Wt 139.0 lb

## 2018-05-31 DIAGNOSIS — G2581 Restless legs syndrome: Secondary | ICD-10-CM

## 2018-05-31 DIAGNOSIS — G479 Sleep disorder, unspecified: Secondary | ICD-10-CM | POA: Diagnosis not present

## 2018-05-31 DIAGNOSIS — R0683 Snoring: Secondary | ICD-10-CM

## 2018-05-31 DIAGNOSIS — G4761 Periodic limb movement disorder: Secondary | ICD-10-CM

## 2018-05-31 DIAGNOSIS — R351 Nocturia: Secondary | ICD-10-CM

## 2018-05-31 NOTE — Patient Instructions (Addendum)
Based on your symptoms and your exam I believe we should look for an underlying organic sleep disorder, such as obstructive sleep apnea/OSA, or dream enactments, or periodic leg movement disorder (PLMD), which is leg twitching in sleep. This condition, PLMD, is typically associated with symptoms of restless leg syndrome, but patients can have periodic leg movements, that is, repetitive twitching in their sleep secondary to medication effects, particularly from taking an SSRI type antidepressant. Sometimes changing the antidepressant regimen can help. If this leg movement disorder disrupts your sleep, it can cause other problems such as daytime sleepiness. Please do not drive if you feel sleepy. Sometimes other conditions can cause leg twitching at night including thyroid dysfunction and iron deficiency. There are some symptomatic medications available for treatment. At this point, we should proceed with a sleep study to further delineate your sleep-related issues.   If you have obstructive sleep apnea, OSA, I want you to consider treatment with CPAP. Please remember, the risks and ramifications of moderate to severe obstructive sleep apnea or OSA are: Cardiovascular disease, including congestive heart failure, stroke, difficult to control hypertension, arrhythmias, and even type 2 diabetes has been linked to untreated OSA. Sleep apnea causes disruption of sleep and sleep deprivation in most cases, which, in turn, can cause recurrent headaches, problems with memory, mood, concentration, focus, and vigilance. Most people with untreated sleep apnea report excessive daytime sleepiness, which can affect their ability to drive. Please do not drive if you feel sleepy.   I plan to see you back after your sleep study to go over the test results and where to go from there. You can continue your medications the same way for now.   We will call you after your sleep study to advise about the results (most likely, you will  hear from RoyaltonKristen, my nurse) and to set up an appointment at the time.    Our sleep lab administrative assistant will call you to schedule your sleep study. If you don't hear back from her by about 2 weeks from now, please feel free to call her at [phone]. This is her direct line and please leave a message with your phone number to call back if you get the voicemail box. She will call back as soon as possible.   Please do not drink wine close to bedtime, as alcohol and your medications at night don't mix well and alcohol is a sleep disrupter.   Your chronic insomnia ultimately will be best treated with cognitive behavioral therapy (CBT-I).

## 2018-05-31 NOTE — Progress Notes (Signed)
Subjective:    Patient ID: Linda Noble is a 62 y.o. female.  HPI     Huston FoleySaima Aanika Defoor, MD, PhD [facility] [address], [address] [address] [place], [place] [id number]  Dear Dr. Renne CriglerPharr,   I saw your patient, Linda Noble, upon your kind request, in my neurologic clinic today for initial consultation of her sleep disorder, in particular, her history of restless leg syndrome. The patient is unaccompanied today. As you know, Linda Noble is a 62 year old right-handed woman with an underlying medical history of osteoporosis, vitamin D deficiency, anxiety, and hypothyroidism, who reports a longstanding Hx of RLS since her 30s and 3940s. Sx were intermittent years ago, but got worse with time. Linda Noble moves in her sleep, wiggles her feet and at times has to get out of bed to move. Linda Noble has a FHx of RLS, including her father, brother, and sister. Her husband snores and sometimes thoughts sleep disruptive for her as Linda Noble is a light sleeper. Linda Noble does not like any noises in the room, does not have a TV in the bedroom and does not like a white noise machine. Linda Noble likes to read before falling asleep. Currently Linda Noble is on Lunesta 3 mg strength, half pill at night and takes 0.5 mg of ropinirole at bedtime. Linda Noble was advised to take a smaller dose around dinnertime but has not ventured out to do so yet. Linda Noble was tried about 3 months ago, on gabapentin, up to 600 mg at night, but had significant flareup of her restless legs and sleep difficulties. Linda Noble has chronic difficulty with sleep onset and sleep maintenance for years, since her 6340s. For her anxiety Linda Noble has been on Lexapro generic in the past, currently back on it at a low dose, 5 mg daily. Linda Noble does report stressors, but these improved lately. I reviewed your office note from 03/17/2018, which you kindly included. Her Epworth sleepiness score is 0 out of 24, fatigue score is 38 out of 63. Linda Noble is married and lives with her husband. Linda Noble currently does not  work, worked in [employer] for about 10 years. Linda Noble has 2 children, one son, one daughter, one 502 yo GD. Linda Noble quit smoking about 25 years ago. Linda Noble drinks alcohol in the form of wine, 2 glasses per day on average, caffeine in the form of coffee, 2 cups per day in the mornings. Tries to be in bed around 10, rise time between 7 and 8. Linda Noble snores mildly. Linda Noble has gained about 10 pounds in the past year or so.  Her Past Medical History Is Significant For: Past Medical History:  Diagnosis Date  . Anemia   . Anxiety   . Heart palpitations   . Leukopenia   . Mitral valve prolapse   . Osteoporosis   . RUQ pain   . Urinary tract bacterial infections    Pt had in their 30's  . Vitamin D deficiency     Her Past Surgical History Is Significant For: Past Surgical History:  Procedure Laterality Date  . CARDIAC CATHETERIZATION  July 2012  . Eyelid surgery Bilateral 11/24/14   Eyelid was droopy  . LIVER BIOPSY      Her Family History Is Significant For: Family History  Problem Relation Age of Onset  . Heart disease Mother   . Heart attack Father   . Hypertension Brother        x2  . Hypertension Sister   . Colon cancer Neg Hx   . Colon polyps  Neg Hx   . Diabetes Neg Hx   . Kidney disease Neg Hx   . Gallbladder disease Neg Hx   . Esophageal cancer Neg Hx   . Stomach cancer Neg Hx   . Rectal cancer Neg Hx     Her Social History Is Significant For: Social History   Socioeconomic History  . Marital status: Married    Spouse name: Not on file  . Number of children: 2  . Years of education: Not on file  . Highest education level: Not on file  Occupational History  . Occupation: Retired  Engineer, production  . Financial resource strain: Not on file  . Food insecurity:    Worry: Not on file    Inability: Not on file  . Transportation needs:    Medical: Not on file    Non-medical: Not on file  Tobacco Use  . Smoking status: Former Smoker    Types: Cigarettes    Last attempt to quit:  11/11/1979    Years since quitting: 38.5  . Smokeless tobacco: Never Used  . Tobacco comment: quit about 20 years ago  Substance and Sexual Activity  . Alcohol use: Yes    Alcohol/week: 3.6 - 4.2 oz    Types: 6 - 7 Standard drinks or equivalent per week    Comment: Occassionally  . Drug use: No  . Sexual activity: Not on file  Lifestyle  . Physical activity:    Days per week: Not on file    Minutes per session: Not on file  . Stress: Not on file  Relationships  . Social connections:    Talks on phone: Not on file    Gets together: Not on file    Attends religious service: Not on file    Active member of club or organization: Not on file    Attends meetings of clubs or organizations: Not on file    Relationship status: Not on file  Other Topics Concern  . Not on file  Social History Narrative  . Not on file    Her Allergies Are:  Allergies  Allergen Reactions  . Belsomra [Suvorexant]   :   Her Current Medications Are:  Outpatient Encounter Medications as of 05/31/2018  Medication Sig  . alendronate (FOSAMAX) 70 MG tablet Take 70 mg by mouth once a week. Take with a full glass of water on an empty stomach.  Marland Kitchen CALCIUM PO Take by mouth.  . Cholecalciferol (VITAMIN D PO) Take by mouth.  . escitalopram (LEXAPRO) 10 MG tablet Take 10 mg by mouth daily.  . Eszopiclone 3 MG TABS Take 1.5 mg by mouth at bedtime.   Marland Kitchen levothyroxine (SYNTHROID, LEVOTHROID) 75 MCG tablet TK 1 T PO QD  . rOPINIRole (REQUIP) 0.5 MG tablet Take 0.5 mg by mouth. 1 tablet 1.5 hours before bed and 1/2 tablet about 6 pm  . metoprolol tartrate (LOPRESSOR) 25 MG tablet Take 1 tablet (25 mg total) by mouth as needed. Take 1/2-1 prn palpitations (Patient not taking: Reported on 05/31/2018)  . [DISCONTINUED] ALPRAZolam (XANAX) 0.25 MG tablet Take 1 tablet (0.25 mg total) by mouth as needed.  . [DISCONTINUED] Calcium Carb-Cholecalciferol (CALCIUM + D3) 600-200 MG-UNIT TABS Take by mouth 2 (two) times daily.  .  [DISCONTINUED] Cholecalciferol (VITAMIN D3) 2000 units TABS Take by mouth.  . [DISCONTINUED] gabapentin (NEURONTIN) 300 MG capsule Take 300 mg by mouth at bedtime.  . [DISCONTINUED] VAGIFEM 10 MCG TABS vaginal tablet Take by mouth 2 (two) times  a week.    No facility-administered encounter medications on file as of 05/31/2018.   :  Review of Systems:  Out of a complete 14 point review of systems, all are reviewed and negative with the exception of these symptoms as listed below: Review of Systems  Neurological:       New patient to discuss not sleeping. Patient has not had a sleep study test done before and states Linda Noble has had a new onset of snoring.   Epworth Sleepiness Scale 0= would never doze 1= slight chance of dozing 2= moderate chance of dozing 3= high chance of dozing  Sitting and reading:0 Watching TV:0 Sitting inactive in a public place (ex. Theater or meeting):0 As a passenger in a car for an hour without a break:0 Lying down to rest in the afternoon:0 Sitting and talking to someone:0 Sitting quietly after lunch (no alcohol):0 In a car, while stopped in traffic:0 Total:0    Objective:  Neurological Exam  Physical Exam Physical Examination:   Vitals:   05/31/18 1527  BP: (!) 142/86  Pulse: (!) 56    General Examination: The patient is a very pleasant 62 y.o. female in no acute distress. Linda Noble appears well-developed and well-nourished and well groomed.   HEENT: Normocephalic, atraumatic, pupils are equal, round and reactive to light and accommodation. Extraocular tracking is good without limitation to gaze excursion or nystagmus noted. Normal smooth pursuit is noted. Hearing is grossly intact. Tympanic membranes are clear bilaterally. Face is symmetric with normal facial animation and normal facial sensation. Speech is clear with no dysarthria noted. There is no hypophonia. There is no lip, neck/head, jaw or voice tremor. Neck is supple with full range of passive and  active motion. There are no carotid bruits on auscultation. Oropharynx exam reveals: mild mouth dryness, adequate dental hygiene and no significant airway crowding. Mallampati is class I. Tongue protrudes centrally and palate elevates symmetrically. Tonsils are small. Neck size is 14 7/8 inches.    Chest: Clear to auscultation without wheezing, rhonchi or crackles noted.  Heart: S1+S2+0, regular and normal without murmurs, rubs or gallops noted.   Abdomen: Soft, non-tender and non-distended with normal bowel sounds appreciated on auscultation.  Extremities: There is no pitting edema in the distal lower extremities bilaterally.   Skin: Warm and dry without trophic changes noted.  Musculoskeletal: exam reveals no obvious joint deformities, tenderness or joint swelling or erythema.   Neurologically:  Mental status: The patient is awake, alert and oriented in all 4 spheres. Her immediate and remote memory, attention, language skills and fund of knowledge are appropriate. There is no evidence of aphasia, agnosia, apraxia or anomia. Speech is clear with normal prosody and enunciation. Thought process is linear. Mood is normal and affect is normal.  Cranial nerves II - XII are as described above under HEENT exam. In addition: shoulder shrug is normal with equal shoulder height noted. Motor exam: Normal bulk, strength and tone is noted. There is no drift, tremor or rebound. Romberg is negative. Reflexes are 2+. Fine motor skills and coordination: intact with normal finger taps, normal hand movements, normal rapid alternating patting, normal foot taps and normal foot agility.  Cerebellar testing: No dysmetria or intention tremor on finger to nose testing. Heel to shin is unremarkable bilaterally. There is no truncal or gait ataxia.  Sensory exam: intact to light touch in the upper and lower extremities.  Gait, station and balance: Linda Noble stands easily. No veering to one side is noted. No leaning  to one side  is noted. Posture is age-appropriate and stance is narrow based. Gait shows normal stride length and pace. No problems turning.               Assessment and Plan:  In summary, Linda Noble is a very pleasant 62 y.o.-year old female with an underlying medical history of osteoporosis, vitamin D deficiency, anxiety, and hypothyroidism, who presents for a sleep consultation for her sleep disturbance including restless leg syndrome, PLMS, chronic sleep onset and sleep maintenance difficulties. Linda Noble started snoring in the past year. For symptomatic treatment of her restless legs Linda Noble is on ropinirole 0.5 mg approximately 30 minutes before her bedtime currently. Linda Noble is also on low-dose Lunesta 1.5 mg each night. I do believe Linda Noble would benefit from a lab attended sleep study to further delineate her chronic sleep difficulties, particularly from the sleep apnea standpoint and PLMD. For her chronic sleep difficulties as far as sleep onset and sleep maintenance, Linda Noble may benefit from cognitive behavioral therapy through psychology practice. I had a long chat with the patient about my findings and the diagnosis of OSA and PLMD, the prognosis and treatment options.  I recommended the following at this time: sleep study with potential positive airway pressure titration. (We will score hypopneas at 3%).   I explained the sleep test procedure to the patient and also outlined possible surgical and non-surgical treatment options of OSA, including the use of CPAP. The patient indicated that Linda Noble would be willing to try CPAP if the need arises. I plan to see her back after the sleep study is completed and encouraged her to call with any interim questions, concerns, problems or updates. Linda Noble is advised to continue her current medication regimen. I answered all her questions today and Linda Noble was in agreement.  Thank you very much for allowing me to participate in the care of this nice patient. If I can be of any further assistance  to you please do not hesitate to call me at [phone].  Sincerely,   Huston Foley, MD, PhD

## 2018-06-03 ENCOUNTER — Telehealth: Payer: Self-pay

## 2018-06-03 DIAGNOSIS — G2581 Restless legs syndrome: Secondary | ICD-10-CM

## 2018-06-03 NOTE — Telephone Encounter (Signed)
Unfortunately, insurance has denied in lab sleep study. They are wanting to rule apnea out first with HST. Please place order for HST.  Then if it is negative we can bring in for in lab study for RLS. Thanks!!

## 2018-06-03 NOTE — Addendum Note (Signed)
Addended by: Geronimo RunningINKINS, Nhi Butrum A on: 06/03/2018 01:58 PM   Modules accepted: Orders

## 2018-06-03 NOTE — Telephone Encounter (Signed)
VO for HST from Dr. Athar received. HST order placed.  

## 2018-07-07 ENCOUNTER — Ambulatory Visit: Payer: BLUE CROSS/BLUE SHIELD | Admitting: Neurology

## 2018-07-07 DIAGNOSIS — G2581 Restless legs syndrome: Secondary | ICD-10-CM

## 2018-07-07 DIAGNOSIS — G479 Sleep disorder, unspecified: Secondary | ICD-10-CM

## 2018-07-07 DIAGNOSIS — G4733 Obstructive sleep apnea (adult) (pediatric): Secondary | ICD-10-CM

## 2018-07-14 ENCOUNTER — Telehealth: Payer: Self-pay

## 2018-07-14 NOTE — Telephone Encounter (Signed)
I called pt and discussed her sleep study results. Pt is agreeable to a follow up for RLS on 08/19/18 at 10:30am. Pt is asking for a sooner appt. I will call her when one becomes available. Pt verbalized understanding of results. Pt had no questions at this time but was encouraged to call back if questions arise.

## 2018-07-14 NOTE — Procedures (Signed)
[facility] [address] [place], [place] [address] NAME:  Linda Noble                                                                DOB: 11-15-1955 MEDICAL RECORD NUMBER [id number]                                                DOS:  07/07/18 REFERRING PHYSICIAN: Merri Brunette, MD STUDY PERFORMED: Home Sleep Test HISTORY: 62 year old woman with a history of osteoporosis, vitamin D deficiency, anxiety, and hypothyroidism, who reports a longstanding history of RLS since her 30s and 70s. Symptoms were intermittent years ago, but got worse with time. She has chronic difficulty with sleep onset and sleep maintenance for years, since her 48s. Her Epworth sleepiness score is 0 out of 24, fatigue score is 38 out of 63, BMI: 21.1.   STUDY RESULTS:  Total Recording Time: 7 hours, 7 minutes (valid test time: 3 hours, 57 min) Total Apnea/Hypopnea Index (AHI): 2.0/h, RDI: 5.4/h Average Oxygen Saturation: 94%, Lowest Oxygen Desaturation: 89%  Total Time Oxygen Saturation Below or at 88%: 0 minutes  Average Heart Rate: 50 bpm (between 43 and 78 bpm) IMPRESSION: Sleep disturbance, NOS RECOMMENDATION: This study does not demonstrate any significant obstructive or central sleep disordered breathing, minimal snoring was noted. Other causes, including circadian rhythm disturbances, an underlying mood disorder, medication effect and/or an underlying medical problem cannot be ruled out based on this test. The patient should be cautioned not to drive, work at heights, or operate dangerous or heavy equipment when tired or sleepy. Review and reiteration of good sleep hygiene measures should be pursued with any patient. Other causes of the patient's symptoms, including circadian rhythm disturbances, an underlying mood disorder, medication effect and/or an underlying medical problem cannot be ruled out based on this test. Clinical correlation is recommended. The patient and her referring  provider will be notified of the test results. The patient will be seen in follow up in sleep clinic at [facility], if needed. I certify that I have reviewed the raw data recording prior to the issuance of this report in accordance with the standards of the American Academy of Sleep Medicine (AASM).    Huston Foley, MD, PhD [facility]) Diplomat, ABPN (Neurology and Sleep)

## 2018-07-14 NOTE — Progress Notes (Signed)
Patient referred by Dr. Renne Crigler, seen by me on 05/31/18, HST on 07/07/18.   Please call and notify the patient that the recent home sleep test did not show any significant obstructive sleep apnea. I can see patient in FU if desired for her RLS.   Huston Foley, MD, PhD [facility])

## 2018-07-14 NOTE — Telephone Encounter (Signed)
-----   Message from Huston Foley, MD sent at 07/14/2018  8:39 AM EDT ----- Patient referred by Dr. Renne Crigler, seen by me on 05/31/18, HST on 07/07/18.   Please call and notify the patient that the recent home sleep test did not show any significant obstructive sleep apnea. I can see patient in FU if desired for her RLS.   Huston Foley, MD, PhD [facility])

## 2018-07-21 NOTE — Telephone Encounter (Signed)
I called pt to offer her a sooner appt with Dr. Frances Furbish tomorrow at 9:30am. If pt calls back and this appt is still available, please offer her this appt.

## 2018-07-28 NOTE — Telephone Encounter (Signed)
I called pt and offered her 2 available appts on 08/02/18 but pt cannot do any of those. Pt is ok keeping her 08/19/18 because her next few weeks are very busy.

## 2018-08-10 ENCOUNTER — Other Ambulatory Visit: Payer: Self-pay | Admitting: Obstetrics and Gynecology

## 2018-08-10 DIAGNOSIS — Z1231 Encounter for screening mammogram for malignant neoplasm of breast: Secondary | ICD-10-CM

## 2018-08-19 ENCOUNTER — Ambulatory Visit: Payer: BLUE CROSS/BLUE SHIELD | Admitting: Neurology

## 2018-08-19 ENCOUNTER — Encounter: Payer: Self-pay | Admitting: Neurology

## 2018-08-19 VITALS — BP 123/79 | HR 62 | Ht 68.0 in | Wt 140.0 lb

## 2018-08-19 DIAGNOSIS — G2581 Restless legs syndrome: Secondary | ICD-10-CM | POA: Diagnosis not present

## 2018-08-19 NOTE — Patient Instructions (Signed)
I think you should continue with the Requip as is, 0.5 mg about 90-120 min before your bedtime.  Your home sleep test was benign, no evidence of sleep apnea.  I think you can follow up with Dr. Renne Crigler routinely. I would be happy to see you back as needed.

## 2018-08-19 NOTE — Progress Notes (Signed)
Subjective:    Patient ID: Linda Noble is a 62 y.o. female.  HPI     Interim history:   Linda Noble is a 62 year old right-handed woman with an underlying medical history of osteoporosis, vitamin D deficiency, anxiety, and hypothyroidism, who presents for follow up consultation of her RLS, after recent sleep testing. The patient is unaccompanied today. I first met her on 05/31/2018 at the request of her primary care physician, at which time she reported a history of restless legs for many years as well as family history of restless legs. She was on ropinirole. She had tried gabapentin but had side effects with it. She was advised to proceed with a sleep study. Her insurance denied a lab attended sleep study. She had a home sleep test on 07/07/2018 which showed no significant sleep disordered breathing, AHI of 2 per hour, average oxygen saturation of 94%, nadir of 89%.w Today, 08/19/2018: She reports that she does not always feel the need to take her 6 PM dose of requip  pill. She does take the 1 pill before her BT. She has insomnia, for which she has been on Lunesta generic 3 mg. Typically, she takes half a pill only on a nightly basis but lately she has had more difficulty and more stress, has taken a whole pill at times. The ropinirole is working reasonably well. She has tried that in the recent past about a year and half ago at which time she eventually felt it was no longer effective. She then tried gabapentin and had worsening of her symptoms. She has started yoga in the evenings which has helped with anxiety and stress reduction.  The patient's allergies, current medications, family history, past medical history, past social history, past surgical history and problem list were reviewed and updated as appropriate.   Previously:   05/31/2018: (She) reports a longstanding Hx of RLS since her 65s and 75s. Sx were intermittent years ago, but got worse with time. She moves in her sleep, wiggles  her feet and at times has to get out of bed to move. She has a FHx of RLS, including her father, brother, and sister. Her husband snores and sometimes thoughts sleep disruptive for her as she is a light sleeper. She does not like any noises in the room, does not have a TV in the bedroom and does not like a white noise machine. She likes to read before falling asleep. Currently she is on Lunesta 3 mg strength, half pill at night and takes 0.5 mg of ropinirole at bedtime. She was advised to take a smaller dose around dinnertime but has not ventured out to do so yet. She was tried about 3 months ago, on gabapentin, up to 600 mg at night, but had significant flareup of her restless legs and sleep difficulties. She has chronic difficulty with sleep onset and sleep maintenance for years, since her 60s. For her anxiety she has been on Lexapro generic in the past, currently back on it at a low dose, 5 mg daily. She does report stressors, but these improved lately. I reviewed your office note from 03/17/2018, which you kindly included. Her Epworth sleepiness score is 0 out of 24, fatigue score is 38 out of 63. She is married and lives with her husband. She currently does not work, worked in Pharmacologist for about 10 years. She has 2 children, one son, one daughter, one 53 yo GD. She quit smoking about 25 years ago. She drinks alcohol in the  form of wine, 2 glasses per day on average, caffeine in the form of coffee, 2 cups per day in the mornings. Tries to be in bed around 10, rise time between 7 and 8. She snores mildly. She has gained about 10 pounds in the past year or so.  Her Past Medical History Is Significant For: Past Medical History:  Diagnosis Date  . Anemia   . Anxiety   . Heart palpitations   . Leukopenia   . Mitral valve prolapse   . Osteoporosis   . RUQ pain   . Urinary tract bacterial infections    Pt had in their 30's  . Vitamin D deficiency     Her Past Surgical History Is Significant For: Past  Surgical History:  Procedure Laterality Date  . CARDIAC CATHETERIZATION  July 2012  . Eyelid surgery Bilateral 11/24/14   Eyelid was droopy  . LIVER BIOPSY      Her Family History Is Significant For: Family History  Problem Relation Age of Onset  . Heart disease Mother   . Heart attack Father   . Hypertension Brother        x2  . Hypertension Sister   . Colon cancer Neg Hx   . Colon polyps Neg Hx   . Diabetes Neg Hx   . Kidney disease Neg Hx   . Gallbladder disease Neg Hx   . Esophageal cancer Neg Hx   . Stomach cancer Neg Hx   . Rectal cancer Neg Hx     Her Social History Is Significant For: Social History   Socioeconomic History  . Marital status: Married    Spouse name: Not on file  . Number of children: 2  . Years of education: Not on file  . Highest education level: Not on file  Occupational History  . Occupation: Retired  Scientific laboratory technician  . Financial resource strain: Not on file  . Food insecurity:    Worry: Not on file    Inability: Not on file  . Transportation needs:    Medical: Not on file    Non-medical: Not on file  Tobacco Use  . Smoking status: Former Smoker    Types: Cigarettes    Last attempt to quit: 11/11/1979    Years since quitting: 38.7  . Smokeless tobacco: Never Used  . Tobacco comment: quit about 20 years ago  Substance and Sexual Activity  . Alcohol use: Yes    Alcohol/week: 6.0 - 7.0 standard drinks    Types: 6 - 7 Standard drinks or equivalent per week    Comment: Occassionally  . Drug use: No  . Sexual activity: Not on file  Lifestyle  . Physical activity:    Days per week: Not on file    Minutes per session: Not on file  . Stress: Not on file  Relationships  . Social connections:    Talks on phone: Not on file    Gets together: Not on file    Attends religious service: Not on file    Active member of club or organization: Not on file    Attends meetings of clubs or organizations: Not on file    Relationship status: Not on  file  Other Topics Concern  . Not on file  Social History Narrative  . Not on file    Her Allergies Are:  Allergies  Allergen Reactions  . Belsomra [Suvorexant]   . Sulfa Antibiotics   :   Her Current Medications Are:  Outpatient  Encounter Medications as of 08/19/2018  Medication Sig  . alendronate (FOSAMAX) 70 MG tablet Take 70 mg by mouth once a week. Take with a full glass of water on an empty stomach.  . escitalopram (LEXAPRO) 5 MG tablet Take 5 mg by mouth daily.  . Eszopiclone 3 MG TABS Take 1.5 mg by mouth at bedtime.   Marland Kitchen levothyroxine (SYNTHROID, LEVOTHROID) 75 MCG tablet TK 1 T PO QD  . metoprolol tartrate (LOPRESSOR) 25 MG tablet Take 1 tablet (25 mg total) by mouth as needed. Take 1/2-1 prn palpitations  . rOPINIRole (REQUIP) 0.5 MG tablet Take 0.5 mg by mouth. 1 tablet 1.5 hours before bed and 1/2 tablet about 6 pm  . CALCIUM PO Take by mouth.  . Cholecalciferol (VITAMIN D PO) Take by mouth.  . [DISCONTINUED] escitalopram (LEXAPRO) 10 MG tablet Take 10 mg by mouth daily.   No facility-administered encounter medications on file as of 08/19/2018.   :  Review of Systems:  Out of a complete 14 point review of systems, all are reviewed and negative with the exception of these symptoms as listed below:  Review of Systems  Neurological:       Pt presents today to discuss her RLS and sleep study results.    Objective:  Neurological Exam  Physical Exam Physical Examination:   Vitals:   08/19/18 1020  BP: 123/79  Pulse: 62    General Examination: The patient is a very pleasant 62 y.o. female in no acute distress. She appears well-developed and well-nourished and well groomed.   HEENT: Normocephalic, atraumatic, pupils are equal, round and reactive to light and accommodation. Extraocular tracking is good without limitation to gaze excursion or nystagmus noted. Normal smooth pursuit is noted. Hearing is grossly intact. Face is symmetric with normal facial animation  and normal facial sensation. Speech is clear with no dysarthria noted. There is no hypophonia. There is no lip, neck/head, jaw or voice tremor. Neck with FROM. There are no carotid bruits on auscultation. Oropharynx exam reveals: no obvious change.   Chest: Clear to auscultation without wheezing, rhonchi or crackles noted.  Heart: S1+S2+0, regular and normal without murmurs, rubs or gallops noted.   Abdomen: Soft, non-tender and non-distended with normal bowel sounds appreciated on auscultation.  Extremities: There is no pitting edema in the distal lower extremities bilaterally.   Skin: Warm and dry without trophic changes noted.  Musculoskeletal: exam reveals no obvious joint deformities, tenderness or joint swelling or erythema.   Neurologically:  Mental status: The patient is awake, alert and oriented in all 4 spheres. Her immediate and remote memory, attention, language skills and fund of knowledge are appropriate. There is no evidence of aphasia, agnosia, apraxia or anomia. Speech is clear with normal prosody and enunciation. Thought process is linear. Mood is normal and affect is normal.  Cranial nerves II - XII are as described above under HEENT exam.  Motor exam: Normal bulk, strength and tone is noted. There is no drift, tremor or rebound. Fine motor skills and coordination: grossly intact.  Cerebellar testing: No dysmetria or intention tremor. There is no truncal or gait ataxia.  Sensory exam: intact to light touch in the upper and lower extremities.  Gait, station and balance: She stands easily. No veering to one side is noted. No leaning to one side is noted. Posture is age-appropriate and stance is narrow based. Gait shows normal stride length and pace. No problems turning.  Assessment and Plan:  In summary, KEYANAH KOZICKI is a very pleasant 62 year old female with an underlying medical history of osteoporosis, vitamin D deficiency, anxiety, and  hypothyroidism, who presents for FU consultation for her sleep disturbance including restless leg syndrome, Hx of PLMS, and chronic sleep onset and sleep maintenance difficulties. She Had a home sleep test in August 2019 which was negative for any significant sleep disordered breathing. She has been on low-dose ropinirole 0.5 mg strength, she takes it about 30-60 minutes before her bedtime, she was encouraged by her primary care physician to take a half pill at 6 PM also. She typically does not take for 6 PM dose and is getting by with the nighttime dose only. She is encouraged to take it more closer to 90 minutes or even 120 minutes before her projected bedtime. She does take Lunesta 1.5 mg each night, up to 3 mg each night. We talked about the importance of good sleep hygiene and stress/anxiety reduction. She has been doing yoga for this which has been helpful. From my end of things she can follow-up on an as-needed basis. I answered all her questions today and she was in agreement. I spent 25 minutes in total face-to-face time with the patient, more than 50% of which was spent in counseling and coordination of care, reviewing test results, reviewing medication and discussing or reviewing the diagnosis of RLS,  its prognosis and treatment options. Pertinent laboratory and imaging test results that were available during this visit with the patient were reviewed by me and considered in my medical decision making (see chart for details).

## 2018-09-28 ENCOUNTER — Ambulatory Visit
Admission: RE | Admit: 2018-09-28 | Discharge: 2018-09-28 | Disposition: A | Discharge status: Home / Self Care | Payer: BLUE CROSS/BLUE SHIELD | Source: Ambulatory Visit | Attending: Obstetrics and Gynecology | Admitting: Obstetrics and Gynecology

## 2018-09-28 DIAGNOSIS — Z1231 Encounter for screening mammogram for malignant neoplasm of breast: Secondary | ICD-10-CM | POA: Diagnosis not present

## 2018-10-11 DIAGNOSIS — M810 Age-related osteoporosis without current pathological fracture: Secondary | ICD-10-CM | POA: Diagnosis not present

## 2018-10-11 DIAGNOSIS — E039 Hypothyroidism, unspecified: Secondary | ICD-10-CM | POA: Diagnosis not present

## 2018-10-11 DIAGNOSIS — E559 Vitamin D deficiency, unspecified: Secondary | ICD-10-CM | POA: Diagnosis not present

## 2018-10-18 DIAGNOSIS — M810 Age-related osteoporosis without current pathological fracture: Secondary | ICD-10-CM | POA: Diagnosis not present

## 2018-10-18 DIAGNOSIS — E079 Disorder of thyroid, unspecified: Secondary | ICD-10-CM | POA: Diagnosis not present

## 2018-10-18 DIAGNOSIS — E039 Hypothyroidism, unspecified: Secondary | ICD-10-CM | POA: Diagnosis not present

## 2018-10-18 DIAGNOSIS — E559 Vitamin D deficiency, unspecified: Secondary | ICD-10-CM | POA: Diagnosis not present

## 2018-12-01 DIAGNOSIS — E039 Hypothyroidism, unspecified: Secondary | ICD-10-CM | POA: Diagnosis not present

## 2018-12-14 IMAGING — MG DIGITAL SCREENING BILATERAL MAMMOGRAM WITH TOMO AND CAD
8 series · 9 of 24 positions shown · non-contrast
Comparison: Previous exam(s).

CLINICAL DATA: Screening.

EXAM:
DIGITAL SCREENING BILATERAL MAMMOGRAM WITH TOMO AND CAD

[R MLO synth-2D]
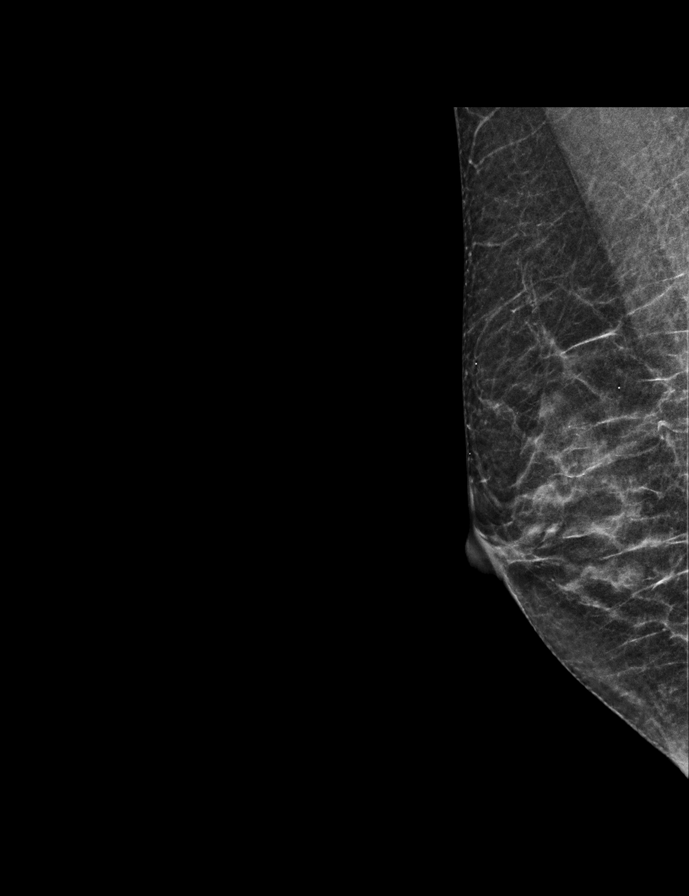

[L MLO synth-2D]
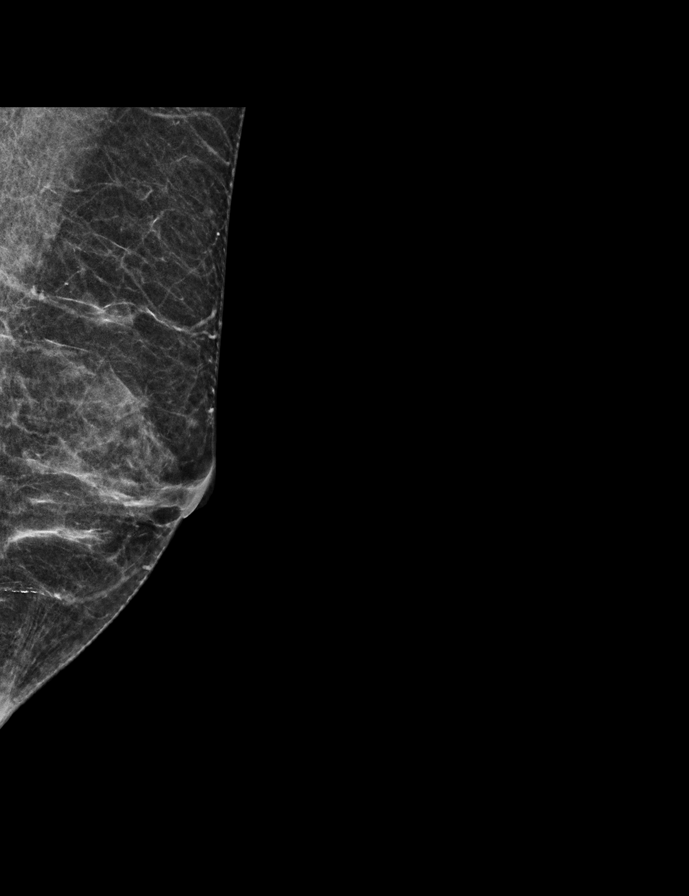

[L CC synth-2D]
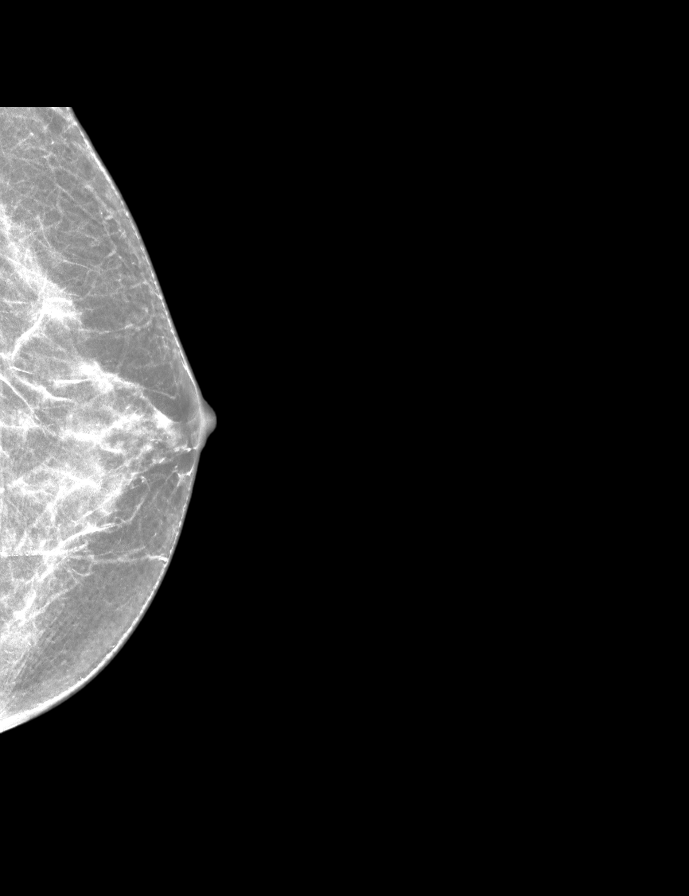

[R CC synth-2D]
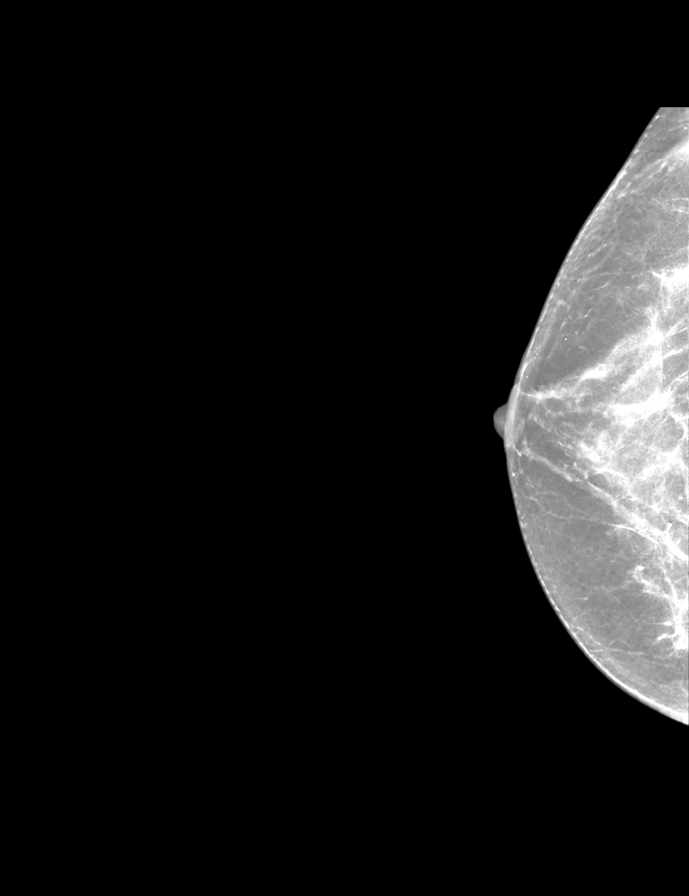

[R CC tomo · 2 of 57 frames shown]
[frame 19/57]
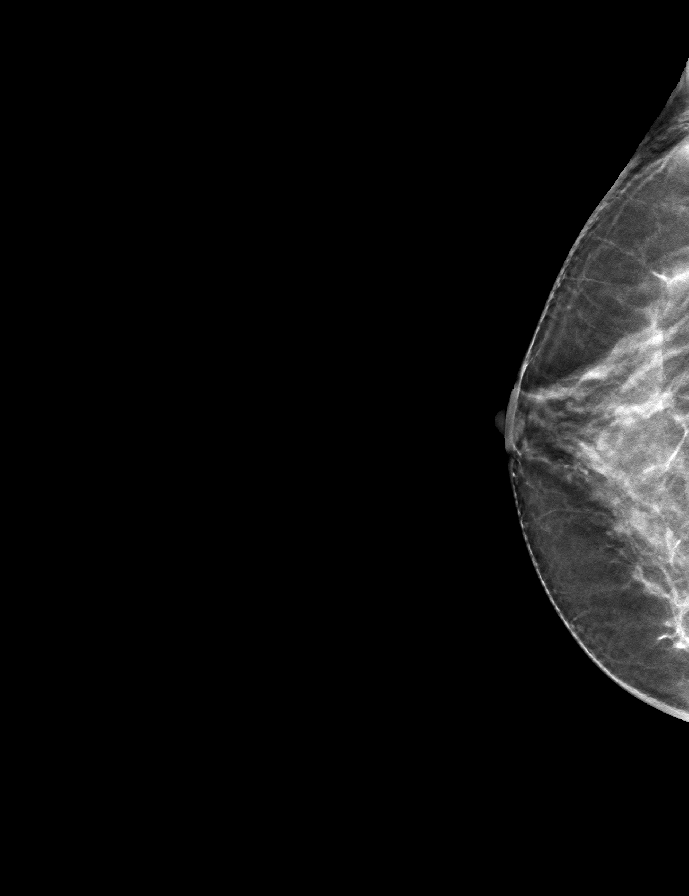
[frame 29/57]
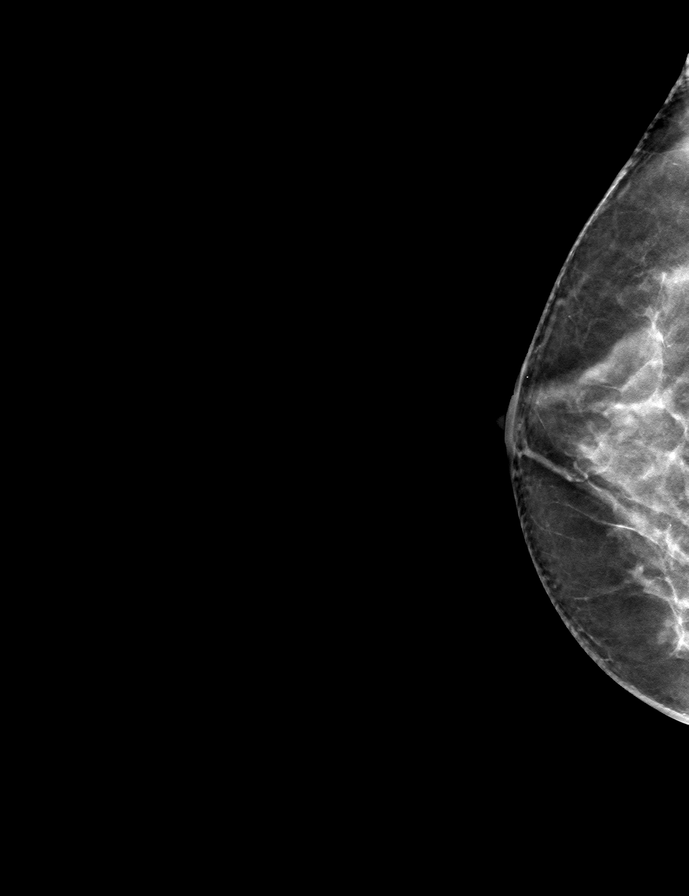

[R MLO tomo · tomo slice 23/45.0]
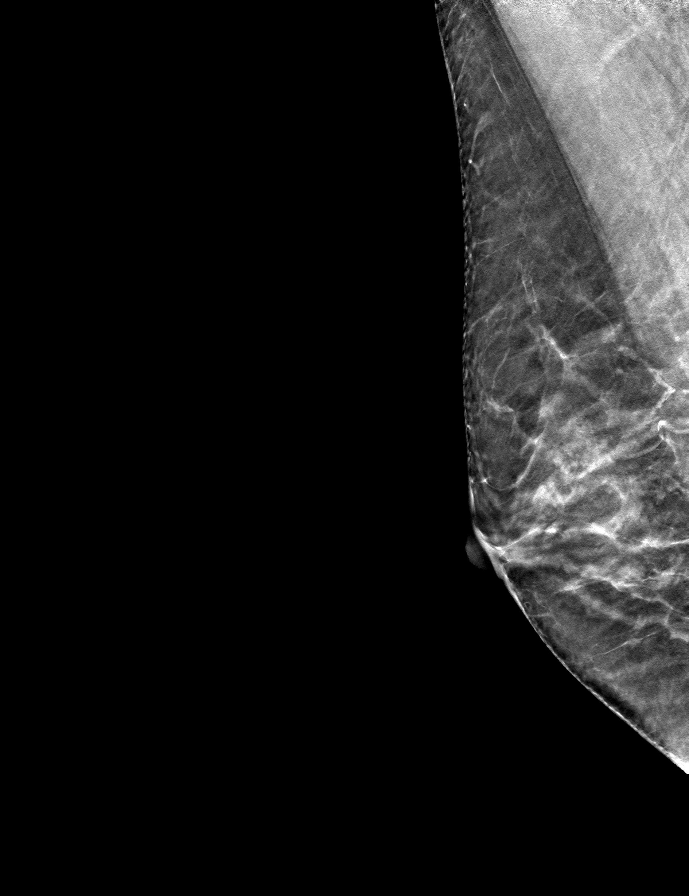

[L CC tomo · tomo slice 29/57.0]
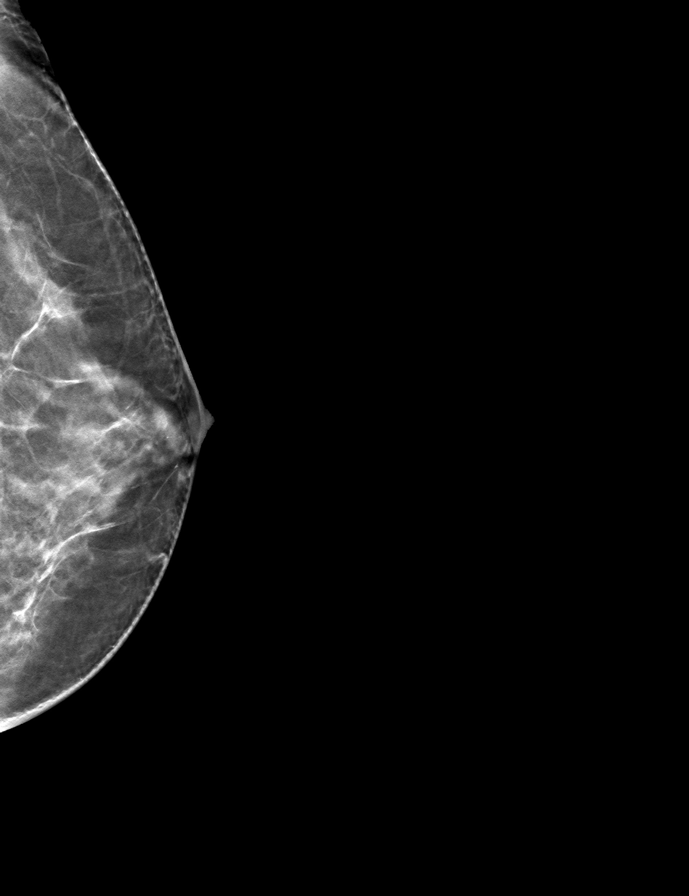

[L MLO tomo · tomo slice 23/44.0]
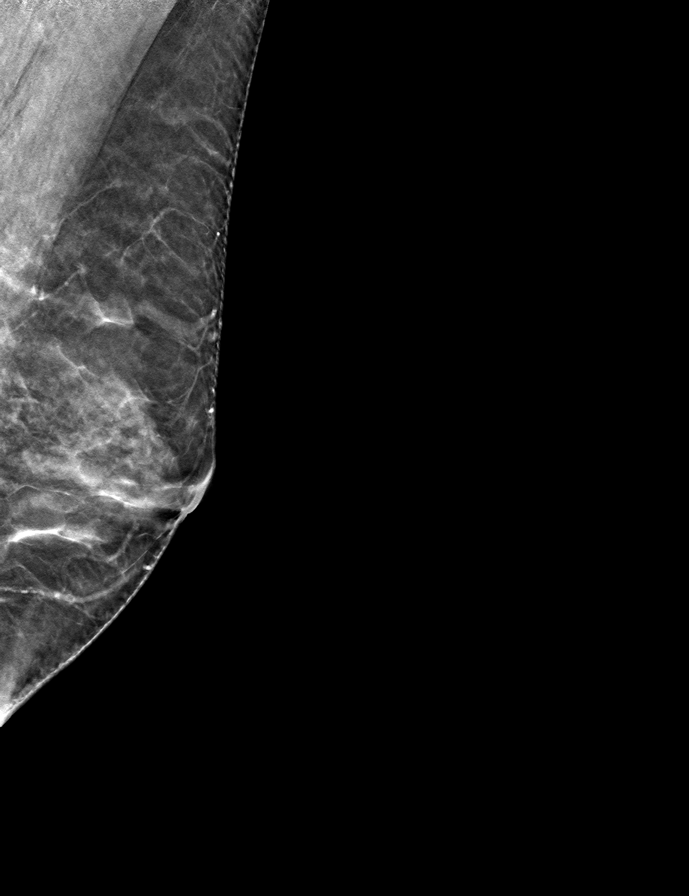

[9 of 24 positions shown; findings below may reference images not displayed]

ACR Breast Density Category c: The breast tissue is heterogeneously
dense, which may obscure small masses.
FINDINGS: There are no findings suspicious for malignancy. Images were
processed with CAD.
IMPRESSION: No mammographic evidence of malignancy. A result letter of this
screening mammogram will be mailed directly to the patient.

RECOMMENDATION:
Screening mammogram in one year. (Code:FT-U-LHB)

BI-RADS CATEGORY  1: Negative.

## 2018-12-15 DIAGNOSIS — Z01419 Encounter for gynecological examination (general) (routine) without abnormal findings: Secondary | ICD-10-CM | POA: Diagnosis not present

## 2018-12-15 DIAGNOSIS — Z130 Encounter for screening for diseases of the blood and blood-forming organs and certain disorders involving the immune mechanism: Secondary | ICD-10-CM | POA: Diagnosis not present

## 2018-12-15 DIAGNOSIS — Z79890 Hormone replacement therapy (postmenopausal): Secondary | ICD-10-CM | POA: Diagnosis not present

## 2018-12-15 DIAGNOSIS — Z1389 Encounter for screening for other disorder: Secondary | ICD-10-CM | POA: Diagnosis not present

## 2018-12-16 DIAGNOSIS — Z124 Encounter for screening for malignant neoplasm of cervix: Secondary | ICD-10-CM | POA: Diagnosis not present

## 2019-04-21 DIAGNOSIS — D225 Melanocytic nevi of trunk: Secondary | ICD-10-CM | POA: Diagnosis not present

## 2019-04-21 DIAGNOSIS — L821 Other seborrheic keratosis: Secondary | ICD-10-CM | POA: Diagnosis not present

## 2019-04-21 DIAGNOSIS — L814 Other melanin hyperpigmentation: Secondary | ICD-10-CM | POA: Diagnosis not present

## 2019-06-27 DIAGNOSIS — E039 Hypothyroidism, unspecified: Secondary | ICD-10-CM | POA: Diagnosis not present

## 2019-06-27 DIAGNOSIS — Z1159 Encounter for screening for other viral diseases: Secondary | ICD-10-CM | POA: Diagnosis not present

## 2019-06-27 DIAGNOSIS — N390 Urinary tract infection, site not specified: Secondary | ICD-10-CM | POA: Diagnosis not present

## 2019-06-27 DIAGNOSIS — Z0000 Encounter for general adult medical examination without abnormal findings: Secondary | ICD-10-CM | POA: Diagnosis not present

## 2019-06-30 DIAGNOSIS — Z23 Encounter for immunization: Secondary | ICD-10-CM | POA: Diagnosis not present

## 2019-06-30 DIAGNOSIS — F411 Generalized anxiety disorder: Secondary | ICD-10-CM | POA: Diagnosis not present

## 2019-06-30 DIAGNOSIS — F5104 Psychophysiologic insomnia: Secondary | ICD-10-CM | POA: Diagnosis not present

## 2019-06-30 DIAGNOSIS — M810 Age-related osteoporosis without current pathological fracture: Secondary | ICD-10-CM | POA: Diagnosis not present

## 2019-06-30 DIAGNOSIS — Z0001 Encounter for general adult medical examination with abnormal findings: Secondary | ICD-10-CM | POA: Diagnosis not present

## 2019-08-19 ENCOUNTER — Other Ambulatory Visit: Payer: Self-pay | Admitting: Obstetrics and Gynecology

## 2019-08-19 DIAGNOSIS — Z1231 Encounter for screening mammogram for malignant neoplasm of breast: Secondary | ICD-10-CM

## 2019-10-04 ENCOUNTER — Other Ambulatory Visit: Payer: Self-pay

## 2019-10-04 ENCOUNTER — Ambulatory Visit
Admission: RE | Admit: 2019-10-04 | Discharge: 2019-10-04 | Disposition: A | Discharge status: Home / Self Care | Payer: BC Managed Care – PPO | Source: Ambulatory Visit | Attending: Obstetrics and Gynecology | Admitting: Obstetrics and Gynecology

## 2019-10-04 DIAGNOSIS — Z1231 Encounter for screening mammogram for malignant neoplasm of breast: Secondary | ICD-10-CM | POA: Diagnosis not present

## 2019-11-29 DIAGNOSIS — M50221 Other cervical disc displacement at C4-C5 level: Secondary | ICD-10-CM | POA: Diagnosis not present

## 2019-11-29 DIAGNOSIS — M4802 Spinal stenosis, cervical region: Secondary | ICD-10-CM | POA: Diagnosis not present

## 2019-11-29 DIAGNOSIS — M50222 Other cervical disc displacement at C5-C6 level: Secondary | ICD-10-CM | POA: Diagnosis not present

## 2019-11-29 DIAGNOSIS — M542 Cervicalgia: Secondary | ICD-10-CM | POA: Diagnosis not present

## 2019-11-29 DIAGNOSIS — M4312 Spondylolisthesis, cervical region: Secondary | ICD-10-CM | POA: Diagnosis not present

## 2019-11-30 DIAGNOSIS — Z23 Encounter for immunization: Secondary | ICD-10-CM | POA: Diagnosis not present

## 2019-12-07 DIAGNOSIS — Z20828 Contact with and (suspected) exposure to other viral communicable diseases: Secondary | ICD-10-CM | POA: Diagnosis not present

## 2019-12-12 ENCOUNTER — Ambulatory Visit: Payer: BC Managed Care – PPO | Attending: Internal Medicine

## 2019-12-12 DIAGNOSIS — Z20822 Contact with and (suspected) exposure to covid-19: Secondary | ICD-10-CM | POA: Diagnosis not present

## 2019-12-13 LAB — NOVEL CORONAVIRUS, NAA: SARS-CoV-2, NAA: NOT DETECTED

## 2019-12-14 DIAGNOSIS — E039 Hypothyroidism, unspecified: Secondary | ICD-10-CM | POA: Diagnosis not present

## 2019-12-14 DIAGNOSIS — R590 Localized enlarged lymph nodes: Secondary | ICD-10-CM | POA: Diagnosis not present

## 2019-12-14 DIAGNOSIS — R509 Fever, unspecified: Secondary | ICD-10-CM | POA: Diagnosis not present

## 2019-12-27 DIAGNOSIS — M47892 Other spondylosis, cervical region: Secondary | ICD-10-CM | POA: Diagnosis not present

## 2019-12-30 DIAGNOSIS — M47892 Other spondylosis, cervical region: Secondary | ICD-10-CM | POA: Diagnosis not present

## 2020-01-04 DIAGNOSIS — M47892 Other spondylosis, cervical region: Secondary | ICD-10-CM | POA: Diagnosis not present

## 2020-01-10 DIAGNOSIS — M47892 Other spondylosis, cervical region: Secondary | ICD-10-CM | POA: Diagnosis not present

## 2020-01-11 DIAGNOSIS — Z01419 Encounter for gynecological examination (general) (routine) without abnormal findings: Secondary | ICD-10-CM | POA: Diagnosis not present

## 2020-01-11 DIAGNOSIS — Z1389 Encounter for screening for other disorder: Secondary | ICD-10-CM | POA: Diagnosis not present

## 2020-01-11 DIAGNOSIS — Z130 Encounter for screening for diseases of the blood and blood-forming organs and certain disorders involving the immune mechanism: Secondary | ICD-10-CM | POA: Diagnosis not present

## 2020-01-11 DIAGNOSIS — E669 Obesity, unspecified: Secondary | ICD-10-CM | POA: Diagnosis not present

## 2020-02-01 DIAGNOSIS — E039 Hypothyroidism, unspecified: Secondary | ICD-10-CM | POA: Diagnosis not present

## 2020-02-01 DIAGNOSIS — E0789 Other specified disorders of thyroid: Secondary | ICD-10-CM | POA: Diagnosis not present

## 2020-02-01 DIAGNOSIS — R5383 Other fatigue: Secondary | ICD-10-CM | POA: Diagnosis not present

## 2020-02-06 DIAGNOSIS — M542 Cervicalgia: Secondary | ICD-10-CM | POA: Diagnosis not present

## 2020-02-06 DIAGNOSIS — F5104 Psychophysiologic insomnia: Secondary | ICD-10-CM | POA: Diagnosis not present

## 2020-02-06 DIAGNOSIS — E039 Hypothyroidism, unspecified: Secondary | ICD-10-CM | POA: Diagnosis not present

## 2020-04-03 DIAGNOSIS — E039 Hypothyroidism, unspecified: Secondary | ICD-10-CM | POA: Diagnosis not present

## 2020-04-04 DIAGNOSIS — L814 Other melanin hyperpigmentation: Secondary | ICD-10-CM | POA: Diagnosis not present

## 2020-04-04 DIAGNOSIS — L578 Other skin changes due to chronic exposure to nonionizing radiation: Secondary | ICD-10-CM | POA: Diagnosis not present

## 2020-04-04 DIAGNOSIS — D225 Melanocytic nevi of trunk: Secondary | ICD-10-CM | POA: Diagnosis not present

## 2020-04-04 DIAGNOSIS — L820 Inflamed seborrheic keratosis: Secondary | ICD-10-CM | POA: Diagnosis not present

## 2020-04-04 DIAGNOSIS — L570 Actinic keratosis: Secondary | ICD-10-CM | POA: Diagnosis not present

## 2020-04-04 DIAGNOSIS — L821 Other seborrheic keratosis: Secondary | ICD-10-CM | POA: Diagnosis not present

## 2020-06-05 DIAGNOSIS — H2513 Age-related nuclear cataract, bilateral: Secondary | ICD-10-CM | POA: Diagnosis not present

## 2020-06-05 DIAGNOSIS — H5203 Hypermetropia, bilateral: Secondary | ICD-10-CM | POA: Diagnosis not present

## 2020-07-02 DIAGNOSIS — Z0000 Encounter for general adult medical examination without abnormal findings: Secondary | ICD-10-CM | POA: Diagnosis not present

## 2020-07-02 DIAGNOSIS — E039 Hypothyroidism, unspecified: Secondary | ICD-10-CM | POA: Diagnosis not present

## 2020-07-02 DIAGNOSIS — Z13220 Encounter for screening for lipoid disorders: Secondary | ICD-10-CM | POA: Diagnosis not present

## 2020-07-05 DIAGNOSIS — Z0000 Encounter for general adult medical examination without abnormal findings: Secondary | ICD-10-CM | POA: Diagnosis not present

## 2020-07-05 DIAGNOSIS — M542 Cervicalgia: Secondary | ICD-10-CM | POA: Diagnosis not present

## 2020-07-05 DIAGNOSIS — H9319 Tinnitus, unspecified ear: Secondary | ICD-10-CM | POA: Diagnosis not present

## 2020-07-05 DIAGNOSIS — G2581 Restless legs syndrome: Secondary | ICD-10-CM | POA: Diagnosis not present

## 2020-07-18 ENCOUNTER — Ambulatory Visit: Payer: BC Managed Care – PPO | Admitting: Family Medicine

## 2020-08-01 ENCOUNTER — Ambulatory Visit: Payer: BC Managed Care – PPO | Admitting: Family Medicine

## 2020-09-20 DIAGNOSIS — Z1231 Encounter for screening mammogram for malignant neoplasm of breast: Secondary | ICD-10-CM

## 2020-09-24 ENCOUNTER — Other Ambulatory Visit: Payer: Self-pay | Admitting: Obstetrics and Gynecology

## 2020-09-24 DIAGNOSIS — Z1231 Encounter for screening mammogram for malignant neoplasm of breast: Secondary | ICD-10-CM

## 2020-11-13 ENCOUNTER — Ambulatory Visit
Admission: RE | Admit: 2020-11-13 | Discharge: 2020-11-13 | Disposition: A | Discharge status: Home / Self Care | Payer: Commercial Managed Care - PPO | Source: Ambulatory Visit | Attending: Obstetrics and Gynecology | Admitting: Obstetrics and Gynecology

## 2020-11-13 ENCOUNTER — Other Ambulatory Visit: Payer: Self-pay

## 2020-11-13 DIAGNOSIS — Z1231 Encounter for screening mammogram for malignant neoplasm of breast: Secondary | ICD-10-CM

## 2021-02-15 NOTE — Progress Notes (Signed)
CARDIOLOGY CONSULT NOTE       Patient ID: Linda Noble MRN: [id number] DOB/AGE: 02/27/1956 65 y.o.  Admit date: (Not on file) Referring Physician: Shelia Media Primary Physician: Deland Pretty, MD Primary Cardiologist: New Reason for Consultation: Palpitaitons  Active Problems:   * No active hospital problems. *   HPI:  65 y.o. referred by Dr Shelia Media for palpitations  She has a history of anxiety, hypothyroidism and RLS with insomnia for which she is on [place] and Requip.  She had a normal cath by Dr Rex Kras in 2012. She was seen by Dr Claiborne Billings in 2014 for palpitations Echo with no frank MVP trace MR EF 55% Rx with PRN metoprolol and alprazolam  She has not had any cardiac testing since 2014  Has had some right knee pain with negative inflammatory labs and xray with early degenerative changes Knee aspirated/injected Rx motrin  Labs with Cr 0.6 K 4.5 TSH 1.65 Hct 41 ESR 16 Normal LFTls LDL 117   She has a trip to [place] planned ? Stress from this. Notes palpitations daily last 3 weeeks Has SSCP As well this can last minutes Not always exertional. No associated diaphoresis , dyspnea or syncope   ROS All other systems reviewed and negative except as noted above  Past Medical History:  Diagnosis Date  . Anemia   . Anxiety   . Elevated liver enzymes   . Heart palpitations   . Leukopenia   . Mitral valve prolapse   . Osteoporosis   . RUQ pain   . Urinary tract bacterial infections    Pt had in their 30's  . Vitamin D deficiency     Family History  Problem Relation Age of Onset  . Heart disease Mother   . Heart attack Father   . Hypertension Brother        x2  . Hypertension Sister   . Colon cancer Neg Hx   . Colon polyps Neg Hx   . Diabetes Neg Hx   . Kidney disease Neg Hx   . Gallbladder disease Neg Hx   . Esophageal cancer Neg Hx   . Stomach cancer Neg Hx   . Rectal cancer Neg Hx     Social History   Socioeconomic History  . Marital status: Married    Spouse name:  Not on file  . Number of children: 2  . Years of education: Not on file  . Highest education level: Not on file  Occupational History  . Occupation: Retired  Tobacco Use  . Smoking status: Former Smoker    Types: Cigarettes    Quit date: 11/11/1979    Years since quitting: 41.3  . Smokeless tobacco: Never Used  . Tobacco comment: quit about 20 years ago  Substance and Sexual Activity  . Alcohol use: Yes    Alcohol/week: 6.0 - 7.0 standard drinks    Types: 6 - 7 Standard drinks or equivalent per week    Comment: Occassionally  . Drug use: No  . Sexual activity: Not on file  Other Topics Concern  . Not on file  Social History Narrative  . Not on file   Social Determinants of Health   Financial Resource Strain: Not on file  Food Insecurity: Not on file  Transportation Needs: Not on file  Physical Activity: Not on file  Stress: Not on file  Social Connections: Not on file  Intimate Partner Violence: Not on file    Past Surgical History:  Procedure Laterality Date  .  CARDIAC CATHETERIZATION  July 2012  . Eyelid surgery Bilateral 11/24/14   Eyelid was droopy  . LIVER BIOPSY        Current Outpatient Medications:  .  alendronate (FOSAMAX) 70 MG tablet, Take 70 mg by mouth once a week. Take with a full glass of water on an empty stomach., Disp: , Rfl:  .  CALCIUM PO, Take by mouth., Disp: , Rfl:  .  Cholecalciferol (VITAMIN D PO), Take by mouth., Disp: , Rfl:  .  escitalopram (LEXAPRO) 5 MG tablet, Take 5 mg by mouth daily., Disp: , Rfl:  .  levothyroxine (SYNTHROID) 88 MCG tablet, Take 88 mcg by mouth daily., Disp: , Rfl:  .  rOPINIRole (REQUIP) 0.5 MG tablet, Take 0.5 mg by mouth. 1 tablet 1.5 hours before bed and 1/2 tablet, Disp: , Rfl:     Physical Exam: Blood pressure (!) 154/72, pulse (!) 54, height _0  (1.727 m), weight 64.9 kg, SpO2 98 %.   Affect appropriate Healthy:  appears stated age 13: normal Neck supple with no adenopathy JVP normal no bruits no  thyromegaly Lungs clear with no wheezing and good diaphragmatic motion Heart:  S1/S2 no murmur, no rub, gallop or click PMI normal Abdomen: benighn, BS positve, no tenderness, no AAA no bruit.  No HSM or HJR Distal pulses intact with no bruits No edema Neuro non-focal Skin warm and dry No muscular weakness   Labs:   Lab Results  Component Value Date   WBC 3.5 (L) 07/28/2013   HGB 13.7 07/28/2013   HCT 40.7 07/28/2013   MCV 90.2 07/28/2013   PLT 188 07/28/2013   No results for input(s): NA, K, CL, CO2, BUN, CREATININE, CALCIUM, PROT, BILITOT, ALKPHOS, ALT, AST, GLUCOSE in the last 168 hours.  Invalid input(s): LABALBU Lab Results  Component Value Date   CKTOTAL 71 06/03/2011   CKMB 1.4 06/03/2011   TROPONINI <0.30 06/03/2011    Lab Results  Component Value Date   CHOL 191 07/28/2013   CHOL 191 06/03/2011   Lab Results  Component Value Date   HDL 72 07/28/2013   HDL 81 06/03/2011   Lab Results  Component Value Date   LDLCALC 107 (H) 07/28/2013   LDLCALC 98 06/03/2011   Lab Results  Component Value Date   TRIG 61 07/28/2013   TRIG 60 06/03/2011   Lab Results  Component Value Date   CHOLHDL 2.7 07/28/2013   CHOLHDL 2.4 06/03/2011   No results found for: LDLDIRECT    Radiology: No results found.  EKG: SR rate 54 nonspecific ST changes    ASSESSMENT AND PLAN:   1. Palpitations: long standing over 10 years with negative w/u including normal echo and cath in 2014 7 day monitor ordered since she is going to travel next week ECG ok 2. Insomnia/Anxiety:  F/u neuro on Lunesta and Requip 3. Thyroid:  On synthroid replacement labs with primary  4. Chest Pain atypical HR low good candidate for cardiac CTA Discussed no need for beta blocker as HR 54 bpm. No contrast allergy known will try to arrange prior to trip to [place]   F/U cardiology 4-6 weeks after testing/trip   Signed: Jenkins Rouge 02/25/2021, 8:32 AM

## 2021-02-25 ENCOUNTER — Encounter: Payer: Self-pay | Admitting: *Deleted

## 2021-02-25 ENCOUNTER — Encounter: Payer: Self-pay | Admitting: Cardiovascular Disease

## 2021-02-25 ENCOUNTER — Other Ambulatory Visit: Payer: Self-pay

## 2021-02-25 ENCOUNTER — Ambulatory Visit (INDEPENDENT_AMBULATORY_CARE_PROVIDER_SITE_OTHER): Payer: Commercial Managed Care - PPO

## 2021-02-25 ENCOUNTER — Telehealth (HOSPITAL_COMMUNITY): Payer: Self-pay | Admitting: Emergency Medicine

## 2021-02-25 ENCOUNTER — Ambulatory Visit: Payer: Commercial Managed Care - PPO | Admitting: Cardiovascular Disease

## 2021-02-25 VITALS — BP 154/72 | HR 54 | Ht 68.0 in | Wt 143.0 lb

## 2021-02-25 DIAGNOSIS — I340 Nonrheumatic mitral (valve) insufficiency: Secondary | ICD-10-CM | POA: Diagnosis not present

## 2021-02-25 DIAGNOSIS — I341 Nonrheumatic mitral (valve) prolapse: Secondary | ICD-10-CM

## 2021-02-25 DIAGNOSIS — Z01812 Encounter for preprocedural laboratory examination: Secondary | ICD-10-CM

## 2021-02-25 DIAGNOSIS — R002 Palpitations: Secondary | ICD-10-CM

## 2021-02-25 DIAGNOSIS — R079 Chest pain, unspecified: Secondary | ICD-10-CM

## 2021-02-25 LAB — BASIC METABOLIC PANEL
BUN/Creatinine Ratio: 17 (ref 12–28)
BUN: 12 mg/dL (ref 8–27)
CO2: 24 mmol/L (ref 20–29)
Calcium: 9.2 mg/dL (ref 8.7–10.3)
Chloride: 106 mmol/L (ref 96–106)
Creatinine, Ser: 0.69 mg/dL (ref 0.57–1.00)
Glucose: 91 mg/dL (ref 65–99)
Potassium: 4 mmol/L (ref 3.5–5.2)
Sodium: 144 mmol/L (ref 134–144)
eGFR: 96 mL/min/{1.73_m2} (ref >59)

## 2021-02-25 NOTE — Progress Notes (Signed)
Patient ID: Linda Noble, female   DOB: [birth year], 65 y.o.   MRN: [id number] Patient enrolled for [employer] to ship a 7 day ZIO XT long term holter monitor to her home.

## 2021-02-25 NOTE — Telephone Encounter (Signed)
Reaching out to patient to offer assistance regarding upcoming cardiac imaging study; pt verbalizes understanding of appt date/time, parking situation and where to check in, pre-test NPO status and medications ordered, and verified current allergies; name and call back number provided for further questions should they arise Meeah Totino RN Navigator Cardiac Imaging [facility] [phone] office [phone] cell 

## 2021-02-25 NOTE — Patient Instructions (Addendum)
Medication Instructions:  NO CHANGES *If you need a refill on your cardiac medications before your next appointment, please call your pharmacy*   Lab Work: BMET TODAY  If you have labs (blood work) drawn today and your tests are completely normal, you will receive your results only by: Marland Kitchen MyChart Message (if you have MyChart) OR . A paper copy in the mail If you have any lab test that is abnormal or we need to change your treatment, we will call you to review the results.   Testing/Procedures: Christena Deem- Long Term Monitor Instructions   Your physician has requested you wear a ZIO patch monitor for ___ days.  This is a single patch monitor.   [employer] supplies one patch monitor per enrollment. Additional stickers are not available. Please do not apply patch if you will be having a Nuclear Stress Test, Echocardiogram, Cardiac CT, MRI, or Chest Xray during the period you would be wearing the monitor. The patch cannot be worn during these tests. You cannot remove and re-apply the ZIO XT patch monitor.  Your ZIO patch monitor will be sent Fed Ex from [employer] directly to your home address. It may take 3-5 days to receive your monitor after you have been enrolled.  Once you have received your monitor, please review the enclosed instructions. Your monitor has already been registered assigning a specific monitor serial # to you.  Billing and Patient Assistance Program Information   We have supplied [employer] with any of your insurance information on file for billing purposes. [employer] offers a sliding scale Patient Assistance Program for patients that do not have insurance, or whose insurance does not completely cover the cost of the ZIO monitor.   You must apply for the Patient Assistance Program to qualify for this discounted rate.     To apply, please call [employer] at [phone], select option 4, then select option 2, and ask to apply for Patient Assistance Program.  Meredeth Ide will ask your  household income, and how many people are in your household.  They will quote your out-of-pocket cost based on that information.  [employer] will also be able to set up a 57-month, interest-free payment plan if needed.  Applying the monitor   Shave hair from upper left chest.  Hold abrader disc by orange tab. Rub abrader in 40 strokes over the upper left chest as indicated in your monitor instructions.  Clean area with 4 enclosed alcohol pads. Let dry.  Apply patch as indicated in monitor instructions. Patch will be placed under collarbone on left side of chest with arrow pointing upward.  Rub patch adhesive wings for 2 minutes. Remove white label marked "1". Remove the white label marked "2". Rub patch adhesive wings for 2 additional minutes.  While looking in a mirror, press and release button in center of patch. A small green light will flash 3-4 times. This will be your only indicator that the monitor has been turned on. ?  Do not shower for the first 24 hours. You may shower after the first 24 hours.  Press the button if you feel a symptom. You will hear a small click. Record Date, Time and Symptom in the Patient Logbook.  When you are ready to remove the patch, follow instructions on the last 2 pages of the Patient Logbook. Stick patch monitor onto the last page of Patient Logbook.  Place Patient Logbook in the blue and white box.  Use locking tab on box and tape box closed securely.  The blue and white box has prepaid postage on it. Please place it in the mailbox as soon as possible. Your physician should have your test results approximately 7 days after the monitor has been mailed back to [facility].  Call [facility] Customer Care at [phone] if you have questions regarding your ZIO XT patch monitor. Call them immediately if you see an orange light blinking on your monitor.  If your monitor falls off in less than 4 days, contact our Monitor department at [phone]. ?If your  monitor becomes loose or falls off after 4 days call [employer] at [phone] for suggestions on securing your monitor.?     Follow-Up: At [facility] you and your health needs are our priority.  As part of our continuing mission to provide you with exceptional heart care, we have created designated Provider Care Teams.  These Care Teams include your primary Cardiologist (physician) and Advanced Practice Providers (APPs -  Physician Assistants and Nurse Practitioners) who all work together to provide you with the care you need, when you need it.  We recommend signing up for the patient portal called "MyChart".  Sign up information is provided on this After Visit Summary.  MyChart is used to connect with patients for Virtual Visits (Telemedicine).  Patients are able to view lab/test results, encounter notes, upcoming appointments, etc.  Non-urgent messages can be sent to your provider as well.   To learn more about what you can do with MyChart, go to [email or web address].    Your next appointment:   3 month(s)  AFTER TRIP  The format for your next appointment:   In Person  Provider:   Charlton Haws, MD Your cardiac CT will be scheduled at one of the below locations:   [facility] [address] [place], [place] [address] [phone]   If scheduled at [facility], please arrive at the [facility] main entrance (entrance A) of [facility] 30 minutes prior to test start time. Proceed to the [facility] Radiology Department (first floor) to check-in and test prep.  If scheduled at [facility], please arrive 15 mins early for check-in and test prep.  Please follow these instructions carefully (unless otherwise directed):  Hold all erectile dysfunction medications at least 3 days (72 hrs) prior to test.  On the Night Before the Test: . Be sure to Drink plenty of water. . Do not consume any caffeinated/decaffeinated  beverages or chocolate 12 hours prior to your test. . Do not take any antihistamines 12 hours prior to your test. On the Day of the Test: . Drink plenty of water until 1 hour prior to the test. . Do not eat any food 4 hours prior to the test. . You may take your regular medications prior to the test.  . Take metoprolol (Lopressor) two hours prior to test. . HOLD Furosemide/Hydrochlorothiazide morning of the test. . FEMALES- please wear underwire-free bra if available   *For Clinical Staff only. Please instruct patient the following:*        -Drink plenty of water       -Hold Furosemide/hydrochlorothiazide morning of the test       -T After the Test: . Drink plenty of water. . After receiving IV contrast, you may experience a mild flushed feeling. This is normal. . On occasion, you may experience a mild rash up to 24 hours after the test. This is not dangerous. If this occurs, you can take Benadryl 25 mg  and increase your fluid intake. . If you experience trouble breathing, this can be serious. If it is severe call 911 IMMEDIATELY. If it is mild, please call our office. . If you take any of these medications: Glipizide/Metformin, Avandament, Glucavance, please do not take 48 hours after completing test unless otherwise instructed.   Once we have confirmed authorization from your insurance company, we will call you to set up a date and time for your test. Based on how quickly your insurance processes prior authorizations requests, please allow up to 4 weeks to be contacted for scheduling your Cardiac CT appointment. Be advised that routine Cardiac CT appointments could be scheduled as many as 8 weeks after your provider has ordered it.  For non-scheduling related questions, please contact the cardiac imaging nurse navigator should you have any questions/concerns: Rockwell Alexandria, Cardiac Imaging Nurse Navigator Larey Brick, Cardiac Imaging Nurse Navigator [facility] Direct Office Dial: [phone]   For scheduling needs, including cancellations and rescheduling, please call [place], [phone].

## 2021-02-26 ENCOUNTER — Telehealth: Payer: Self-pay | Admitting: *Deleted

## 2021-02-26 ENCOUNTER — Ambulatory Visit (HOSPITAL_COMMUNITY)
Admission: RE | Admit: 2021-02-26 | Discharge: 2021-02-26 | Disposition: A | Discharge status: Home / Self Care | Payer: Commercial Managed Care - PPO | Source: Ambulatory Visit | Attending: Cardiovascular Disease | Admitting: Cardiovascular Disease

## 2021-02-26 DIAGNOSIS — R079 Chest pain, unspecified: Secondary | ICD-10-CM

## 2021-02-26 MED ORDER — IOHEXOL 350 MG/ML SOLN
80.0000 mL | Freq: Once | INTRAVENOUS | Status: AC | PRN
  Administered 2021-02-26: 80 mL via INTRAVENOUS

## 2021-02-26 MED ORDER — NITROGLYCERIN 0.4 MG SL SUBL
SUBLINGUAL_TABLET | SUBLINGUAL | Status: AC
  Filled 2021-02-26: qty 2

## 2021-02-26 MED ORDER — NITROGLYCERIN 0.4 MG SL SUBL
0.8000 mg | SUBLINGUAL_TABLET | Freq: Once | SUBLINGUAL | Status: AC
Start: 2021-02-26 — End: 2021-02-26
  Administered 2021-02-26: 0.8 mg via SUBLINGUAL

## 2021-02-26 NOTE — Telephone Encounter (Signed)
Informed pt of results. Pt verbalized understanding. 

## 2021-02-26 NOTE — Telephone Encounter (Signed)
-----   Message from Wendall Stade, MD sent at 02/26/2021 12:32 PM EDT ----- Calcium score 0 normal coronary arteries great

## 2021-03-06 NOTE — Progress Notes (Signed)
Order(s) created erroneously. Erroneous order ID: [id number]  Order moved by: SHEALY, DEBRA D  Order move date/time: 03/06/2021 11:34 AM  Source Patient: [id number]  Source Contact: 03/04/2021  Destination Patient: [id number]  Destination Contact: 06/28/2020 

## 2021-03-06 NOTE — Progress Notes (Signed)
Order(s) created erroneously. Erroneous order ID: [id number]  Order moved by: SHEALY, DEBRA D  Order move date/time: 03/06/2021 11:22 AM  Source Patient: [id number]  Source Contact: 03/04/2021  Destination Patient: [id number]  Destination Contact: 06/28/2020 

## 2021-03-06 NOTE — Progress Notes (Signed)
Order(s) created erroneously. Erroneous order ID: [id number]  Order moved by: SHEALY, DEBRA D  Order move date/time: 03/06/2021 11:13 AM  Source Patient: [id number]  Source Contact: 03/05/2021  Destination Patient: [id number]  Destination Contact: 06/28/2020 

## 2021-03-06 NOTE — Progress Notes (Signed)
Order(s) created erroneously. Erroneous order ID: [id number]  Order moved by: SHEALY, DEBRA D  Order move date/time: 03/06/2021 11:09 AM  Source Patient: [id number]  Source Contact: 03/04/2021  Destination Patient: [id number]  Destination Contact: 06/28/2020 

## 2021-03-20 DIAGNOSIS — R002 Palpitations: Secondary | ICD-10-CM | POA: Diagnosis not present

## 2021-06-07 NOTE — Progress Notes (Signed)
CARDIOLOGY CONSULT NOTE       Patient ID: Linda Noble MRN: [id number] DOB/AGE: [birth year] 67 y.o.  Admit date: (Not on file) Referring Physician: Shelia Noble Primary Physician: Linda Pretty, MD Primary Cardiologist: Linda Noble Reason for Consultation: Palpitations   HPI:  65 y.o. referred by Linda Linda Noble for palpitations  First seen 03/01/21 She has a history of anxiety, hypothyroidism and RLS with insomnia for which she is on Lunesta and Requip.  She had a normal cath by Linda Noble in 2012. She was seen by Linda Noble in 2014 for palpitations Echo with no frank MVP trace MR EF 55% Rx with PRN metoprolol and alprazolam  She has not had any cardiac testing since 2014  Has had some right knee pain with negative inflammatory labs and xray with early degenerative changes Knee aspirated/injected Rx motrin  Labs with Cr 0.6 K 4.5 TSH 1.65 Hct 41 ESR 16 Normal LFTls LDL 117   Had a nice trip to [place] Rained in [place] unfortunately  February  Notes palpitations daily last 3 weeeks Has SSCP As well this can last minutes Not always exertional. No associated diaphoresis , dyspnea or syncope   Monitor 02/25/21 showed only shrot runs of atrial tachycardia nothing exceeding  20 seconds Average HR was 63 bpm   Cardiac CTA 02/26/21 normal right dominant cors calcium score 0  Much better now   ROS All other systems reviewed and negative except as noted above  Past Medical History:  Diagnosis Date   Anemia    Anxiety    Elevated liver enzymes    Heart palpitations    Leukopenia    Mitral valve prolapse    Osteoporosis    RUQ pain    Urinary tract bacterial infections    Pt had in their 30's   Vitamin D deficiency     Family History  Problem Relation Age of Onset   Heart disease Mother    Heart attack Father    Hypertension Brother        x2   Hypertension Sister    Colon cancer Neg Hx    Colon polyps Neg Hx    Diabetes Neg Hx    Kidney disease Neg Hx    Gallbladder disease Neg Hx     Esophageal cancer Neg Hx    Stomach cancer Neg Hx    Rectal cancer Neg Hx     Social History   Socioeconomic History   Marital status: Married    Spouse name: Not on file   Number of children: 2   Years of education: Not on file   Highest education level: Not on file  Occupational History   Occupation: Retired  Tobacco Use   Smoking status: Former    Types: Cigarettes    Quit date: 11/11/1979    Years since quitting: 41.6   Smokeless tobacco: Never   Tobacco comments:    quit about 20 years ago  Substance and Sexual Activity   Alcohol use: Yes    Alcohol/week: 6.0 - 7.0 standard drinks    Types: 6 - 7 Standard drinks or equivalent per week    Comment: Occassionally   Drug use: No   Sexual activity: Not on file  Other Topics Concern   Not on file  Social History Narrative   Not on file   Social Determinants of Health   Financial Resource Strain: Not on file  Food Insecurity: Not on file  Transportation Needs: Not on file  Physical Activity:  Not on file  Stress: Not on file  Social Connections: Not on file  Intimate Partner Violence: Not on file    Past Surgical History:  Procedure Laterality Date   CARDIAC CATHETERIZATION  July 2012   Eyelid surgery Bilateral 11/24/14   Eyelid was droopy   LIVER BIOPSY        Current Outpatient Medications:    alendronate (FOSAMAX) 70 MG tablet, Take 70 mg by mouth once a week. Take with a full glass of water on an empty stomach., Disp: , Rfl:    CALCIUM PO, Take by mouth., Disp: , Rfl:    Cholecalciferol (VITAMIN D PO), Take by mouth., Disp: , Rfl:    escitalopram (LEXAPRO) 5 MG tablet, Take 5 mg by mouth daily., Disp: , Rfl:    levothyroxine (SYNTHROID) 88 MCG tablet, Take 88 mcg by mouth daily., Disp: , Rfl:    rOPINIRole (REQUIP) 0.5 MG tablet, Take 0.5 mg by mouth. 1 tablet 1.5 hours before bed and 1/2 tablet, Disp: , Rfl:     Physical Exam: Blood pressure 128/72, pulse 64, height 5' 8"  (1.727 m), weight 63.3 kg, SpO2  97 %.   Affect appropriate Healthy:  appears stated age 21: normal Neck supple with no adenopathy JVP normal no bruits no thyromegaly Lungs clear with no wheezing and good diaphragmatic motion Heart:  S1/S2 no murmur, no rub, gallop or click PMI normal Abdomen: benighn, BS positve, no tenderness, no AAA no bruit.  No HSM or HJR Distal pulses intact with no bruits No edema Neuro non-focal Skin warm and dry No muscular weakness   Labs:   Lab Results  Component Value Date   WBC 3.5 (L) 07/28/2013   HGB 13.7 07/28/2013   HCT 40.7 07/28/2013   MCV 90.2 07/28/2013   PLT 188 07/28/2013   No results for input(s): NA, K, CL, CO2, BUN, CREATININE, CALCIUM, PROT, BILITOT, ALKPHOS, ALT, AST, GLUCOSE in the last 168 hours.  Invalid input(s): LABALBU Lab Results  Component Value Date   CKTOTAL 71 06/03/2011   CKMB 1.4 06/03/2011   TROPONINI <0.30 06/03/2011    Lab Results  Component Value Date   CHOL 191 07/28/2013   CHOL 191 06/03/2011   Lab Results  Component Value Date   HDL 72 07/28/2013   HDL 81 06/03/2011   Lab Results  Component Value Date   LDLCALC 107 (H) 07/28/2013   LDLCALC 98 06/03/2011   Lab Results  Component Value Date   TRIG 61 07/28/2013   TRIG 60 06/03/2011   Lab Results  Component Value Date   CHOLHDL 2.7 07/28/2013   CHOLHDL 2.4 06/03/2011   No results found for: LDLDIRECT    Radiology: No results found.  EKG: SR rate 54 nonspecific ST changes    ASSESSMENT AND PLAN:   1. Palpitations: long standing over 10 years with negative w/u including normal echo and cath in 2014 7 Recent monitor with self limited atrial tachycardia  Declined PRN inderal  2. Insomnia: on Lunesta and Requip 3. Thyroid:  On synthroid replacement labs with primary  4. Chest Pain atypical  Calcium score 0 normal right dominant coronary arteries on CT 02/26/21   F/U cardiology  PRN   Signed: Jenkins Noble 06/14/2021, 9:18 AM

## 2021-06-14 ENCOUNTER — Other Ambulatory Visit: Payer: Self-pay

## 2021-06-14 ENCOUNTER — Ambulatory Visit: Payer: Commercial Managed Care - PPO | Admitting: Cardiovascular Disease

## 2021-06-14 ENCOUNTER — Encounter: Payer: Self-pay | Admitting: Cardiovascular Disease

## 2021-06-14 VITALS — BP 128/72 | HR 64 | Ht 68.0 in | Wt 139.6 lb

## 2021-06-14 DIAGNOSIS — R079 Chest pain, unspecified: Secondary | ICD-10-CM

## 2021-06-14 DIAGNOSIS — F5101 Primary insomnia: Secondary | ICD-10-CM | POA: Diagnosis not present

## 2021-06-14 DIAGNOSIS — R002 Palpitations: Secondary | ICD-10-CM

## 2021-06-14 NOTE — Patient Instructions (Signed)
Medication Instructions:  *If you need a refill on your cardiac medications before your next appointment, please call your pharmacy*  Lab Work: If you have labs (blood work) drawn today and your tests are completely normal, you will receive your results only by: MyChart Message (if you have MyChart) OR A paper copy in the mail If you have any lab test that is abnormal or we need to change your treatment, we will call you to review the results.  Follow-Up: At [facility], you and your health needs are our priority.  As part of our continuing mission to provide you with exceptional heart care, we have created designated Provider Care Teams.  These Care Teams include your primary Cardiologist (physician) and Advanced Practice Providers (APPs -  Physician Assistants and Nurse Practitioners) who all work together to provide you with the care you need, when you need it.  We recommend signing up for the patient portal called "MyChart".  Sign up information is provided on this After Visit Summary.  MyChart is used to connect with patients for Virtual Visits (Telemedicine).  Patients are able to view lab/test results, encounter notes, upcoming appointments, etc.  Non-urgent messages can be sent to your provider as well.   To learn more about what you can do with MyChart, go to [email or web address].    Your next appointment:   1 year(s)  The format for your next appointment:   In Person  Provider:   You may see Dr. Nishan or one of the following Advanced Practice Providers on your designated Care Team:   Laura Ingold, NP  

## 2021-11-28 ENCOUNTER — Other Ambulatory Visit: Payer: Self-pay | Admitting: Obstetrics and Gynecology

## 2021-11-28 DIAGNOSIS — Z1231 Encounter for screening mammogram for malignant neoplasm of breast: Secondary | ICD-10-CM

## 2021-12-19 ENCOUNTER — Other Ambulatory Visit: Payer: Self-pay

## 2021-12-19 ENCOUNTER — Ambulatory Visit
Admission: RE | Admit: 2021-12-19 | Discharge: 2021-12-19 | Disposition: A | Discharge status: Home / Self Care | Payer: BC Managed Care – PPO | Source: Ambulatory Visit | Attending: Obstetrics and Gynecology | Admitting: Obstetrics and Gynecology

## 2021-12-19 DIAGNOSIS — Z1231 Encounter for screening mammogram for malignant neoplasm of breast: Secondary | ICD-10-CM | POA: Diagnosis not present

## 2021-12-27 DIAGNOSIS — J069 Acute upper respiratory infection, unspecified: Secondary | ICD-10-CM | POA: Diagnosis not present

## 2021-12-27 DIAGNOSIS — J029 Acute pharyngitis, unspecified: Secondary | ICD-10-CM | POA: Diagnosis not present

## 2021-12-29 DIAGNOSIS — E039 Hypothyroidism, unspecified: Secondary | ICD-10-CM | POA: Insufficient documentation

## 2021-12-29 DIAGNOSIS — D72819 Decreased white blood cell count, unspecified: Secondary | ICD-10-CM | POA: Insufficient documentation

## 2021-12-29 DIAGNOSIS — G2581 Restless legs syndrome: Secondary | ICD-10-CM | POA: Insufficient documentation

## 2021-12-29 DIAGNOSIS — M810 Age-related osteoporosis without current pathological fracture: Secondary | ICD-10-CM | POA: Insufficient documentation

## 2021-12-29 DIAGNOSIS — N898 Other specified noninflammatory disorders of vagina: Secondary | ICD-10-CM | POA: Insufficient documentation

## 2021-12-29 DIAGNOSIS — F5104 Psychophysiologic insomnia: Secondary | ICD-10-CM | POA: Insufficient documentation

## 2021-12-29 DIAGNOSIS — J018 Other acute sinusitis: Secondary | ICD-10-CM | POA: Diagnosis not present

## 2021-12-29 DIAGNOSIS — F411 Generalized anxiety disorder: Secondary | ICD-10-CM | POA: Insufficient documentation

## 2021-12-29 DIAGNOSIS — H9319 Tinnitus, unspecified ear: Secondary | ICD-10-CM | POA: Insufficient documentation

## 2021-12-29 DIAGNOSIS — E559 Vitamin D deficiency, unspecified: Secondary | ICD-10-CM | POA: Insufficient documentation

## 2021-12-29 DIAGNOSIS — E8809 Other disorders of plasma-protein metabolism, not elsewhere classified: Secondary | ICD-10-CM | POA: Insufficient documentation

## 2021-12-29 DIAGNOSIS — M1990 Unspecified osteoarthritis, unspecified site: Secondary | ICD-10-CM | POA: Insufficient documentation

## 2022-01-27 DIAGNOSIS — Z124 Encounter for screening for malignant neoplasm of cervix: Secondary | ICD-10-CM | POA: Diagnosis not present

## 2022-01-27 DIAGNOSIS — Z1389 Encounter for screening for other disorder: Secondary | ICD-10-CM | POA: Diagnosis not present

## 2022-01-27 DIAGNOSIS — Z130 Encounter for screening for diseases of the blood and blood-forming organs and certain disorders involving the immune mechanism: Secondary | ICD-10-CM | POA: Diagnosis not present

## 2022-01-27 DIAGNOSIS — Z01419 Encounter for gynecological examination (general) (routine) without abnormal findings: Secondary | ICD-10-CM | POA: Diagnosis not present

## 2022-01-27 DIAGNOSIS — E669 Obesity, unspecified: Secondary | ICD-10-CM | POA: Diagnosis not present

## 2022-02-25 DIAGNOSIS — Z01419 Encounter for gynecological examination (general) (routine) without abnormal findings: Secondary | ICD-10-CM | POA: Diagnosis not present

## 2022-04-15 DIAGNOSIS — L578 Other skin changes due to chronic exposure to nonionizing radiation: Secondary | ICD-10-CM | POA: Diagnosis not present

## 2022-04-15 DIAGNOSIS — L814 Other melanin hyperpigmentation: Secondary | ICD-10-CM | POA: Diagnosis not present

## 2022-04-15 DIAGNOSIS — L821 Other seborrheic keratosis: Secondary | ICD-10-CM | POA: Diagnosis not present

## 2022-04-15 DIAGNOSIS — D225 Melanocytic nevi of trunk: Secondary | ICD-10-CM | POA: Diagnosis not present

## 2022-07-07 DIAGNOSIS — H04123 Dry eye syndrome of bilateral lacrimal glands: Secondary | ICD-10-CM | POA: Diagnosis not present

## 2022-07-07 DIAGNOSIS — H5203 Hypermetropia, bilateral: Secondary | ICD-10-CM | POA: Diagnosis not present

## 2022-07-07 DIAGNOSIS — H2513 Age-related nuclear cataract, bilateral: Secondary | ICD-10-CM | POA: Diagnosis not present

## 2022-07-10 DIAGNOSIS — E039 Hypothyroidism, unspecified: Secondary | ICD-10-CM | POA: Diagnosis not present

## 2022-07-10 DIAGNOSIS — Z0000 Encounter for general adult medical examination without abnormal findings: Secondary | ICD-10-CM | POA: Diagnosis not present

## 2022-07-10 DIAGNOSIS — M810 Age-related osteoporosis without current pathological fracture: Secondary | ICD-10-CM | POA: Diagnosis not present

## 2022-07-16 DIAGNOSIS — R002 Palpitations: Secondary | ICD-10-CM | POA: Diagnosis not present

## 2022-07-16 DIAGNOSIS — E039 Hypothyroidism, unspecified: Secondary | ICD-10-CM | POA: Diagnosis not present

## 2022-07-16 DIAGNOSIS — M810 Age-related osteoporosis without current pathological fracture: Secondary | ICD-10-CM | POA: Diagnosis not present

## 2022-07-16 DIAGNOSIS — F5104 Psychophysiologic insomnia: Secondary | ICD-10-CM | POA: Diagnosis not present

## 2022-07-16 DIAGNOSIS — Z0000 Encounter for general adult medical examination without abnormal findings: Secondary | ICD-10-CM | POA: Diagnosis not present

## 2022-07-16 DIAGNOSIS — F411 Generalized anxiety disorder: Secondary | ICD-10-CM | POA: Diagnosis not present

## 2022-07-23 NOTE — Progress Notes (Signed)
I, Philbert Riser, LAT, ATC acting as a scribe for Clementeen Graham, MD.  Subjective:    CC: R knee & L shoulder pain  HPI: Pt is a 66 y/o female c/o R knee and L shoulder pain.   R knee pain: Pt reports R knee pain intermittently since her 30's. Pt locates pain to the anterior medial aspect of the R knee.   R Knee swelling: yes Mechanical symptoms: yes Aggravates: TTP, knee flexion, being on the floor w/ her grandkids Treatments tried: IBU, prior steroid injection 1-2 years ago  Pt also c/o L shoulder pain x /. Pt locates pain to the superior aspect, trapz/clavicle area and all over the [facility] joint. Pt sleeps on her L side.   Neck pain: no Radiates: no UE Numbness/tingling: no UE Weakness: no Aggravates: overhead motions Treatments tried: nothing  Pertinent review of Systems: No fevers or chills  Relevant historical information: Mitral valve prolapse Osteoporosis has completed 6 years of Fosamax.   Objective:    Vitals:   07/24/22 0957  BP: (!) 150/86  Pulse: (!) 58  SpO2: 98%   General: Well Developed, well nourished, and in no acute distress.   MSK: Left shoulder: Normal-appearing Normal motion pain with abduction.   Positive Hawkins and Neer's test.  Positive empty can test Negative Yergason's and speeds test. Intact strength.  Right knee: Mild to moderate joint effusion. Normal motion. Tender palpation medial joint line. Stable ligamentous exam. Intact strength.   Lab and Radiology Results  Diagnostic Limited MSK Ultrasound of: Right knee Quad tendon intact normal. Moderate joint effusion present superior patellar space. Normal patellar tendon. Normal lateral joint line. Medial joint line slightly narrowed.  Degenerative appearing medial meniscus. Posterior knee no Baker's cyst. Impression: Medial DJD  Diagnostic Limited MSK Ultrasound of: Left shoulder Biceps tendon normal-appearing Subscapularis tendon normal. Supraspinatus tendon intact  normal. Minimal to trace subacromial bursitis. Infraspinatus tendon normal. AC joint normal-appearing Impression: Largely normal-appearing MSK ultrasound examination of the shoulder.  X-ray images left shoulder and right knee obtained today personally and independently interpreted  Left shoulder: Minimal DJD glenohumeral and AC joints.  No acute fractures are present.  Right knee: Mild patellofemoral and mild to trace medial compartment DJD.  Await formal radiology review    Impression and Recommendations:    Assessment and Plan: 66 y.o. female with right knee pain and swelling.  Patient has mild knee arthritis on x-ray per my read.  There probably is a degenerative meniscus tear as well as part of her pain and dysfunction.  Plan for compressive knee sleeve and Voltaren gel.  Additionally refer to physical therapy for quad strengthening.  If not improving steroid injection or even MRI would be helpful.  Left shoulder pain: This is a chronic ongoing problem.  Pain thought to be primarily due to impingement.  Additionally she has some periscapular and trapezius dysfunction as well.  Physical therapy should also be helpful for this.  Check as needed or following physical therapy completion in about 6 weeks.  Additionally we talked about osteoporosis.  She has a history of osteoporosis managed with Fosamax for 6 years.  She is currently off of bisphosphonates.  She has a DEXA scan upcoming in January 2024.  Next step treatment would likely be Prolia or Evenity.  We talked about both medication options.  Happy to provide osteoporosis management if needed.  PDMP not reviewed this encounter. Orders Placed This Encounter  Procedures   [place] LIMITED JOINT SPACE STRUCTURES LOW RIGHT(NO  LINKED CHARGES)    Order Specific Question:   Reason for Exam (SYMPTOM  OR DIAGNOSIS REQUIRED)    Answer:   right knee pain    Order Specific Question:   Preferred imaging location?    Answer:   [facility] Knee AP/LAT W/Sunrise Right    Standing Status:   Future    Number of Occurrences:   1    Standing Expiration Date:   08/23/2022    Order Specific Question:   Reason for Exam (SYMPTOM  OR DIAGNOSIS REQUIRED)    Answer:   right knee pain    Order Specific Question:   Preferred imaging location?    Answer:   [facility]   DG Shoulder Left    Standing Status:   Future    Number of Occurrences:   1    Standing Expiration Date:   08/23/2022    Order Specific Question:   Reason for Exam (SYMPTOM  OR DIAGNOSIS REQUIRED)    Answer:   left shoulder pain    Order Specific Question:   Preferred imaging location?    Answer:   [facility]   Ambulatory referral to Physical Therapy    Referral Priority:   Routine    Referral Type:   Physical Medicine    Referral Reason:   Specialty Services Required    Requested Specialty:   Physical Therapy    Number of Visits Requested:   1   No orders of the defined types were placed in this encounter.   Discussed warning signs or symptoms. Please see discharge instructions. Patient expresses understanding.   The above documentation has been reviewed and is accurate and complete Clementeen Graham, M.D.

## 2022-07-24 ENCOUNTER — Ambulatory Visit (INDEPENDENT_AMBULATORY_CARE_PROVIDER_SITE_OTHER): Payer: BC Managed Care – PPO

## 2022-07-24 ENCOUNTER — Ambulatory Visit: Payer: Self-pay

## 2022-07-24 ENCOUNTER — Ambulatory Visit: Payer: BC Managed Care – PPO | Admitting: Family Medicine

## 2022-07-24 VITALS — BP 150/86 | HR 58 | Ht 68.0 in | Wt 144.0 lb

## 2022-07-24 DIAGNOSIS — M25512 Pain in left shoulder: Secondary | ICD-10-CM | POA: Diagnosis not present

## 2022-07-24 DIAGNOSIS — G8929 Other chronic pain: Secondary | ICD-10-CM | POA: Diagnosis not present

## 2022-07-24 DIAGNOSIS — M25561 Pain in right knee: Secondary | ICD-10-CM

## 2022-07-24 DIAGNOSIS — M810 Age-related osteoporosis without current pathological fracture: Secondary | ICD-10-CM

## 2022-07-24 DIAGNOSIS — M19012 Primary osteoarthritis, left shoulder: Secondary | ICD-10-CM | POA: Diagnosis not present

## 2022-07-24 NOTE — Patient Instructions (Addendum)
Thank you for coming in today.   Please get an Xray today before you leave   Please use Voltaren gel (Generic Diclofenac Gel) up to 4x daily for pain as needed.  This is available over-the-counter as both the name brand Voltaren gel and the generic diclofenac gel.   I recommend you obtained a compression sleeve to help with your joint problems. There are many options on the market however I recommend obtaining a full knee Body Helix compression sleeve.  You can find information (including how to appropriate measure yourself for sizing) can be found at www.Body [employer].ch.  Many of these products are health savings account (HSA) eligible.   You can use the compression sleeve at any time throughout the day but is most important to use while being active as well as for 2 hours post-activity.   It is appropriate to ice following activity with the compression sleeve in place.   I've referred you to Physical Therapy.  Let us know if you don't hear from them in one week.   Recheck as needed.   We can do an injection any time. Typically 1-2 week prior to travel is a good time to have a knee injection.

## 2022-07-28 NOTE — Progress Notes (Signed)
Left shoulder x-ray has a little bit of arthritis changes.

## 2022-07-28 NOTE — Progress Notes (Signed)
Right knee x-ray looks okay to radiology.

## 2022-08-11 ENCOUNTER — Ambulatory Visit: Payer: BC Managed Care – PPO | Admitting: Physical Therapy

## 2022-08-13 DIAGNOSIS — R001 Bradycardia, unspecified: Secondary | ICD-10-CM | POA: Diagnosis not present

## 2022-08-13 DIAGNOSIS — J0100 Acute maxillary sinusitis, unspecified: Secondary | ICD-10-CM | POA: Diagnosis not present

## 2022-08-25 ENCOUNTER — Encounter: Payer: Self-pay | Admitting: Physical Therapy

## 2022-08-25 ENCOUNTER — Ambulatory Visit: Payer: BC Managed Care – PPO | Admitting: Physical Therapy

## 2022-08-25 DIAGNOSIS — M25561 Pain in right knee: Secondary | ICD-10-CM | POA: Diagnosis not present

## 2022-08-25 DIAGNOSIS — M25512 Pain in left shoulder: Secondary | ICD-10-CM

## 2022-08-25 DIAGNOSIS — M6281 Muscle weakness (generalized): Secondary | ICD-10-CM

## 2022-08-25 DIAGNOSIS — R262 Difficulty in walking, not elsewhere classified: Secondary | ICD-10-CM

## 2022-08-25 NOTE — Therapy (Signed)
OUTPATIENT PHYSICAL THERAPY LOWER EXTREMITY EVALUATION   Patient Name: Linda Noble MRN: [id number] DOB:1956-08-09, 66 y.o., female Today's Date: 08/25/2022   PT End of Session - 08/25/22 0940     Visit Number 1    Number of Visits 16    Date for PT Re-Evaluation 10/24/22    PT Start Time 0930    PT Stop Time 1015    PT Time Calculation (min) 45 min    Activity Tolerance Patient tolerated treatment well    Behavior During Therapy [facility] for tasks assessed/performed             Past Medical History:  Diagnosis Date   Anemia    Anxiety    Elevated liver enzymes    Heart palpitations    Leukopenia    Mitral valve prolapse    Osteoporosis    RUQ pain    Urinary tract bacterial infections    Pt had in their 30's   Vitamin D deficiency    Past Surgical History:  Procedure Laterality Date   CARDIAC CATHETERIZATION  July 2012   Eyelid surgery Bilateral 11/24/14   Eyelid was droopy   LIVER BIOPSY     Patient Active Problem List   Diagnosis Date Noted   Chronic insomnia 12/29/2021   Generalized anxiety disorder 12/29/2021   Hyperproteinemia 12/29/2021   Hypothyroidism 12/29/2021   Leukopenia 12/29/2021   Osteoarthritis 12/29/2021   Age related osteoporosis 12/29/2021   Restless legs syndrome 12/29/2021   Tinnitus 12/29/2021   Vaginal dryness 12/29/2021   Vitamin D deficiency 12/29/2021   Mild mitral regurgitation 10/24/2013   Heart palpitations 07/27/2013   MVP (mitral valve prolapse) 07/27/2013    PCP: Deland Pretty, MD  REFERRING PROVIDER: Gregor Hams, MD  REFERRING DIAG: [phone] (ICD-10-CM) - Chronic pain of right knee M25.512,G89.29 (ICD-10-CM) - Chronic left shoulder pain  THERAPY DIAG:  Acute pain of right knee  Acute pain of left shoulder  Muscle weakness (generalized)  Difficulty in walking, not elsewhere classified  Rationale for Evaluation and Treatment Rehabilitation  ONSET DATE: years for the Rt knee, it's gradually  worsening  SUBJECTIVE:   SUBJECTIVE STATEMENT: Pt stating pain today 3/10 in her Rt knee at rest. 4-5/10 with sit to stand and no current pain in left shoulder at rest.   PERTINENT HISTORY: Anemia, MVP, Osteoporosis, vit-D deficiency, RLS, anxiety, heart palpations, cardiac catheterization  PAIN:  NPRS scale: 3/10 Pain location: Rt knee Pain description: achy, tightness Aggravating factors: climbing stairs Relieving factors: over the counter pain meds  PRECAUTIONS: None    Osteoporosis caution for DN  WEIGHT BEARING RESTRICTIONS No  FALLS:  Has patient fallen in last 6 months? No  LIVING ENVIRONMENT: Lives with: lives with their family Lives in: House/apartment Stairs: Yes: Internal: flight steps; on left going up Has following equipment at home: None Pt also has a condo in [place] where she has 3 flight of stairs.   OCCUPATION: retired   PLOF: Independent  PATIENT GOALS be able    OBJECTIVE:   DIAGNOSTIC FINDINGS:  FINDINGS: 07/25/22: shoulder No acute fracture or dislocation. Mild degenerative changes of the acromioclavicular joint and glenohumeral joint. No area of erosion or osseous destruction. No unexpected radiopaque foreign body. Soft tissues are unremarkable. FINDINGS: 07/25/22: Knee Osteopenia. No acute fracture or dislocation. Joint spaces and alignment are maintained. No area of erosion or osseous destruction. No unexpected radiopaque foreign body. Soft tissues are unremarkable.     PATIENT SURVEYS:  08/25/22:  FOTO intake:  57% predicted:  58%  COGNITION:  Overall cognitive status: WFL    SENSATION: 08/25/22: WFL  EDEMA:  08/25/22:  Circumferential: Rt knee: 39 centimeters       Left Knee : 28 centimeters  MUSCLE LENGTH: 08/25/22: Hamstrings: Right 85 deg; Left 90 deg   POSTURE: mild forward head and shoulder   PALPATION: 08/25/22:  TTP: Medial Rt knee joint line, with Tenderness around pocket of inflammation  LOWER  EXTREMITY ROM:  Active ROM Right Eval 08/25/22 Left Eval 08/25/22  Hip flexion 138 140  Hip extension    Hip abduction    Hip adduction    Hip internal rotation    Hip external rotation    Knee flexion 132 139  Knee extension -5 0   (Blank rows = not tested)  Shoulder ROM   Rt 08/25/22 Active supine Left 08/25/22 Active supine  Shoulder flexion 164 144  Shoulder  extension 48 45  shoulder abduciton 165 146  Shoulder ER 78 70          LOWER EXTREMITY MMT:  MMT Right Eval 08/25/22 Left Eval 08/25/22  Hip flexion 4/5 5/5  Hip extension    Hip abduction    Hip adduction    Hip internal rotation    Hip external rotation    Knee flexion 4/5 5/5  Knee extension 4/5 5/5   (Blank rows = not tested)  LOWER EXTREMITY SPECIAL TESTS:  Knee special tests: Lachman Test: negative  FUNCTIONAL TESTS:  5 times sit to stand: 13 seconds no UE support  GAIT: Distance walked: 50 feet  Assistive device utilized: None Level of assistance: Complete Independence Comments: WFL for short distances in clinic gym on level surface   TODAY'S TREATMENT: Therex:    HEP instruction/performance c cues for techniques, handout provided.  Trial set performed of each for comprehension and symptom assessment.  See below for exercise list    PATIENT EDUCATION:  Education details: HEP, POC Person educated: Patient Education method: Explanation, Demonstration, Verbal cues, and Handouts Education comprehension: verbalized understanding, returned demonstration, and verbal cues required    HOME EXERCISE PROGRAM: Access Code: [email or web address] URL: [email or web address] Date: 08/25/2022 Prepared by: Narda Amber  Exercises - Supine Shoulder Flexion Extension AAROM with Dowel  - 2 x daily - 7 x weekly - 2 sets - 10 reps - Supine Shoulder External Rotation with Dowel  - 2 x daily - 7 x weekly - 10 reps - 3 seconds hold - Seated Long Arc Quad with Hip Adduction  - 2 x  daily - 7 x weekly - 2 sets - 10 reps - 3 seconds hold - Supine Knee Extension Strengthening  - 2 x daily - 7 x weekly - 2 sets - 10 reps - 5 seconds hold - Supine Bridge  - 2 x daily - 7 x weekly - 2 sets - 10 reps - 5 seconds hold  ASSESSMENT:  CLINICAL IMPRESSION: Patient is a 66 y.o. who comes to clinic with complaints of Rt knee pain and mild left shoulder/scapular pain with mobility, strength and movement coordination deficits that impair their ability to perform usual daily and recreational functional activities without increase difficulty/symptoms at this time. Pt's main concern is her Rt knee. Pt stating her shoulder has improved some since her MD visit.  Patient to benefit from skilled PT services to address impairments and limitations to improve to previous level of function without restriction secondary to condition.     OBJECTIVE IMPAIRMENTS difficulty walking, decreased ROM, decreased  strength, increased edema, impaired UE functional use, and pain.   ACTIVITY LIMITATIONS carrying, lifting, standing, squatting, sleeping, and caring for others  PARTICIPATION LIMITATIONS: driving, community activity, and occupation  PERSONAL FACTORS 3+ comorbidities: see above  are also affecting patient's functional outcome.   REHAB POTENTIAL: Good  CLINICAL DECISION MAKING: Stable/uncomplicated  EVALUATION COMPLEXITY: Low   GOALS: Goals reviewed with patient? Yes  Short term PT Goals (target date for Short term goals are 3 weeks 10/15/22) Patient will demonstrate independent use of home exercise program to maintain progress from in clinic treatments. Goal status: New   Long term PT goals (target dates for all long term goals are 8 weeks  10/24/22 )   1. Patient will demonstrate/report pain at worst less than or equal to 2/10 to facilitate minimal limitation in daily activity secondary to pain symptoms. Goal status: New   2. Patient will demonstrate independent use of home exercise  program to facilitate ability to maintain/progress functional gains from skilled physical therapy services. Goal status: New   3. Patient will demonstrate FOTO outcome > or = 58 % to indicate reduced disability due to condition. Goal status: New   4.  Patient will demonstrate Rt LE MMT 5/5 throughout to faciltiate usual transfers, stairs, squatting at [facility] for daily life.   Goal status: New   5.  pt will improve her left shoulder flexion to >/= 155 degrees with no pain reported.    Goal status: New   6.  pt will be able to navigate 1 flight of stairs with no hand rail with no knee pain reported.   Goal status: New  7. Pt will be able to lift 20# from floor to counter height with no pain reported using correct body mechanics.   A. Goal status: New     PLAN:  PT FREQUENCY: 1x/week  PT DURATION: 8 weeks  PLANNED INTERVENTIONS: Therapeutic exercises, Therapeutic activity, Neuro Muscular re-education, Balance training, Gait training, Patient/Family education, Joint mobilization, Stair training, DME instructions, Dry Needling, Electrical stimulation, Traction, Cryotherapy, Moist heat, Taping, Ultrasound, Ionotophoresis 4mg /ml Dexamethasone, and Manual therapy.  All included unless contraindicated   PLAN FOR NEXT SESSION: Review HEP knowledge/results.            , PT, MPT 08/25/2022, 9:43 AM

## 2022-09-02 ENCOUNTER — Ambulatory Visit: Payer: BC Managed Care – PPO | Admitting: Physical Therapy

## 2022-09-02 ENCOUNTER — Encounter: Payer: Self-pay | Admitting: Physical Therapy

## 2022-09-02 DIAGNOSIS — R262 Difficulty in walking, not elsewhere classified: Secondary | ICD-10-CM | POA: Diagnosis not present

## 2022-09-02 DIAGNOSIS — M25561 Pain in right knee: Secondary | ICD-10-CM

## 2022-09-02 DIAGNOSIS — M25512 Pain in left shoulder: Secondary | ICD-10-CM | POA: Diagnosis not present

## 2022-09-02 DIAGNOSIS — M6281 Muscle weakness (generalized): Secondary | ICD-10-CM

## 2022-09-02 NOTE — Progress Notes (Signed)
CARDIOLOGY CONSULT NOTE       Patient ID: Linda Noble MRN: [id number] DOB/AGE: November 12, 1955 66 y.o.  Admit date: (Not on file) Referring Physician: Shelia Media Primary Physician: Deland Pretty, MD Primary Cardiologist: Johnsie Cancel Reason for Consultation: Palpitations   HPI:  66 y.o. referred by Dr Shelia Media for palpitations  First seen 03/01/21 She has a history of anxiety, hypothyroidism and RLS with insomnia for which she is on Lunesta and Requip.  She had a normal cath by Dr Rex Kras in 2012. She was seen by Dr Claiborne Billings in 2014 for palpitations Echo with no frank MVP trace MR EF 55% Rx with PRN metoprolol and alprazolam  She has not had any cardiac testing since 2014  Has had some right knee pain with negative inflammatory labs and xray with early degenerative changes Knee aspirated/injected Rx motrin  Labs with Cr 0.6 K 4.5 TSH 1.65 Hct 41 ESR 16 Normal LFTls LDL 117   2022 nice trip to [place] Rained in [place] unfortunately  February  Notes palpitations daily last 3 weeeks Has SSCP As well this can last minutes Not always exertional. No associated diaphoresis , dyspnea or syncope   Monitor 02/25/21 showed only shrot runs of atrial tachycardia nothing exceeding  20 seconds Average HR was 63 bpm   Cardiac CTA 02/26/21 normal right dominant cors calcium score 0  Seeing PT/OT for left shoulder pain and right knee pain/swelling   ***  ROS All other systems reviewed and negative except as noted above  Past Medical History:  Diagnosis Date  . Anemia   . Anxiety   . Elevated liver enzymes   . Heart palpitations   . Leukopenia   . Mitral valve prolapse   . Osteoporosis   . RUQ pain   . Urinary tract bacterial infections    Pt had in their 30's  . Vitamin D deficiency     Family History  Problem Relation Age of Onset  . Heart disease Mother   . Heart attack Father   . Hypertension Brother        x2  . Hypertension Sister   . Colon cancer Neg Hx   . Colon polyps Neg Hx   . Diabetes Neg  Hx   . Kidney disease Neg Hx   . Gallbladder disease Neg Hx   . Esophageal cancer Neg Hx   . Stomach cancer Neg Hx   . Rectal cancer Neg Hx     Social History   Socioeconomic History  . Marital status: Married    Spouse name: Not on file  . Number of children: 2  . Years of education: Not on file  . Highest education level: Not on file  Occupational History  . Occupation: Retired  Tobacco Use  . Smoking status: Former    Types: Cigarettes    Quit date: 11/11/1979    Years since quitting: 42.8  . Smokeless tobacco: Never  . Tobacco comments:    quit about 20 years ago  Substance and Sexual Activity  . Alcohol use: Yes    Alcohol/week: 6.0 - 7.0 standard drinks of alcohol    Types: 6 - 7 Standard drinks or equivalent per week    Comment: Occassionally  . Drug use: No  . Sexual activity: Not on file  Other Topics Concern  . Not on file  Social History Narrative  . Not on file   Social Determinants of Health   Financial Resource Strain: Not on file  Food Insecurity: Not on  file  Transportation Needs: Not on file  Physical Activity: Not on file  Stress: Not on file  Social Connections: Not on file  Intimate Partner Violence: Not on file    Past Surgical History:  Procedure Laterality Date  . CARDIAC CATHETERIZATION  July 2012  . Eyelid surgery Bilateral 11/24/14   Eyelid was droopy  . LIVER BIOPSY        Current Outpatient Medications:  .  CALCIUM PO, Take by mouth., Disp: , Rfl:  .  Cholecalciferol (VITAMIN D PO), Take by mouth., Disp: , Rfl:  .  escitalopram (LEXAPRO) 5 MG tablet, Take 5 mg by mouth daily., Disp: , Rfl:  .  levothyroxine (SYNTHROID) 88 MCG tablet, Take 88 mcg by mouth daily., Disp: , Rfl:  .  rOPINIRole (REQUIP) 0.5 MG tablet, Take 0.5 mg by mouth. 1 tablet 1.5 hours before bed and 1/2 tablet, Disp: , Rfl:     Physical Exam: There were no vitals taken for this visit.   Affect appropriate Healthy:  appears stated age 70: normal Neck  supple with no adenopathy JVP normal no bruits no thyromegaly Lungs clear with no wheezing and good diaphragmatic motion Heart:  S1/S2 no murmur, no rub, gallop or click PMI normal Abdomen: benighn, BS positve, no tenderness, no AAA no bruit.  No HSM or HJR Distal pulses intact with no bruits No edema Neuro non-focal Skin warm and dry No muscular weakness   Labs:   Lab Results  Component Value Date   WBC 3.5 (L) 07/28/2013   HGB 13.7 07/28/2013   HCT 40.7 07/28/2013   MCV 90.2 07/28/2013   PLT 188 07/28/2013   No results for input(s): "NA", "K", "CL", "CO2", "BUN", "CREATININE", "CALCIUM", "PROT", "BILITOT", "ALKPHOS", "ALT", "AST", "GLUCOSE" in the last 168 hours.  Invalid input(s): "LABALBU" Lab Results  Component Value Date   CKTOTAL 71 06/03/2011   CKMB 1.4 06/03/2011   TROPONINI <0.30 06/03/2011    Lab Results  Component Value Date   CHOL 191 07/28/2013   CHOL 191 06/03/2011   Lab Results  Component Value Date   HDL 72 07/28/2013   HDL 81 06/03/2011   Lab Results  Component Value Date   LDLCALC 107 (H) 07/28/2013   LDLCALC 98 06/03/2011   Lab Results  Component Value Date   TRIG 61 07/28/2013   TRIG 60 06/03/2011   Lab Results  Component Value Date   CHOLHDL 2.7 07/28/2013   CHOLHDL 2.4 06/03/2011   No results found for: "LDLDIRECT"    Radiology: No results found.  EKG: SR rate 54 nonspecific ST changes    ASSESSMENT AND PLAN:   1. Palpitations: long standing over 10 years with negative w/u including normal echo and cath in 2014 , cardiac CTA   Recent monitor  5.24.22 with self limited atrial tachycardia  Declined PRN inderal  2. Insomnia: on Lunesta and Requip 3. Thyroid:  On synthroid replacement labs with primary  4. Chest Pain atypical  Calcium score 0 normal right dominant coronary arteries on CT 02/26/21   F/U cardiology  PRN   Signed: Jenkins Rouge 09/02/2022, 4:20 PM

## 2022-09-02 NOTE — Therapy (Signed)
OUTPATIENT PHYSICAL THERAPY TREATMENT NOTE   Patient Name: Linda Noble MRN: [id number] DOB:[birth year], 66 y.o., female Today's Date: 09/02/2022  PCP: Deland Pretty, MD REFERRING PROVIDER: Gregor Hams, MD  END OF SESSION:   PT End of Session - 09/02/22 1513     Visit Number 2    Number of Visits 16    Date for PT Re-Evaluation 10/24/22    PT Start Time 1430    PT Stop Time 1510    PT Time Calculation (min) 40 min    Activity Tolerance Patient tolerated treatment well    Behavior During Therapy [facility] for tasks assessed/performed             Past Medical History:  Diagnosis Date   Anemia    Anxiety    Elevated liver enzymes    Heart palpitations    Leukopenia    Mitral valve prolapse    Osteoporosis    RUQ pain    Urinary tract bacterial infections    Pt had in their 30's   Vitamin D deficiency    Past Surgical History:  Procedure Laterality Date   CARDIAC CATHETERIZATION  July 2012   Eyelid surgery Bilateral 11/24/14   Eyelid was droopy   LIVER BIOPSY     Patient Active Problem List   Diagnosis Date Noted   Chronic insomnia 12/29/2021   Generalized anxiety disorder 12/29/2021   Hyperproteinemia 12/29/2021   Hypothyroidism 12/29/2021   Leukopenia 12/29/2021   Osteoarthritis 12/29/2021   Age related osteoporosis 12/29/2021   Restless legs syndrome 12/29/2021   Tinnitus 12/29/2021   Vaginal dryness 12/29/2021   Vitamin D deficiency 12/29/2021   Mild mitral regurgitation 10/24/2013   Heart palpitations 07/27/2013   MVP (mitral valve prolapse) 07/27/2013    REFERRING DIAG: M25.561,G89.29 (ICD-10-CM) - Chronic pain of right knee M25.512,G89.29 (ICD-10-CM) - Chronic left shoulder pain  THERAPY DIAG:  Acute pain of right knee  Acute pain of left shoulder  Muscle weakness (generalized)  Difficulty in walking, not elsewhere classified  Rationale for Evaluation and Treatment Rehabilitation  PERTINENT HISTORY: Anemia, MVP, Osteoporosis, vit-D  deficiency, RLS, anxiety, heart palpations, cardiac catheterization  PRECAUTIONS: none  SUBJECTIVE:                                                                                                                                                                                      SUBJECTIVE STATEMENT:  Pt arriving today reporting 3-4/10 pain in her Rt knee and left shoulder. Pt stating she has been sitting most of the day at her computer. Pt also stating she has only been able to get into a routine of doing her  HEP once daily.    PAIN:  Are you having pain? Yes: NPRS scale: 3-4/10 Pain location: rt knee and left shoulder Pain description: achy Aggravating factors: the more active I am the more pain I have Relieving factors: Advil as needed   OBJECTIVE: (objective measures completed at initial evaluation unless otherwise dated)  DIAGNOSTIC FINDINGS:  FINDINGS: 07/25/22: shoulder No acute fracture or dislocation. Mild degenerative changes of the acromioclavicular joint and glenohumeral joint. No area of erosion or osseous destruction. No unexpected radiopaque foreign body. Soft tissues are unremarkable. FINDINGS: 07/25/22: Knee Osteopenia. No acute fracture or dislocation. Joint spaces and alignment are maintained. No area of erosion or osseous destruction. No unexpected radiopaque foreign body. Soft tissues are unremarkable.         PATIENT SURVEYS:  08/25/22:  FOTO intake:  57% predicted:  58%   COGNITION:           Overall cognitive status: WFL                    SENSATION: 08/25/22: WFL   EDEMA:  08/25/22:  Circumferential: Rt knee: 39 centimeters                           Left Knee : 28 centimeters   MUSCLE LENGTH: 08/25/22: Hamstrings: Right 85 deg; Left 90 deg     POSTURE: mild forward head and shoulder     PALPATION: 08/25/22:  TTP: Medial Rt knee joint line, with Tenderness around pocket of inflammation   LOWER EXTREMITY ROM:   Active ROM  Right Eval 08/25/22 Left Eval 08/25/22  Hip flexion 138 140  Hip extension      Hip abduction      Hip adduction      Hip internal rotation      Hip external rotation      Knee flexion 132 139  Knee extension -5 0   (Blank rows = not tested)   Shoulder ROM     Rt 08/25/22 Active supine Left 08/25/22 Active supine  Shoulder flexion 164 144  Shoulder  extension 48 45  shoulder abduciton 165 146  Shoulder ER 78 70                 LOWER EXTREMITY MMT:   MMT Right Eval 08/25/22 Left Eval 08/25/22  Hip flexion 4/5 5/5  Hip extension      Hip abduction      Hip adduction      Hip internal rotation      Hip external rotation      Knee flexion 4/5 5/5  Knee extension 4/5 5/5   (Blank rows = not tested)   LOWER EXTREMITY SPECIAL TESTS:  Knee special tests: Lachman Test: negative   FUNCTIONAL TESTS:  5 times sit to stand: 13 seconds no UE support   GAIT: Distance walked: 50 feet  Assistive device utilized: None Level of assistance: Complete Independence Comments: WFL for short distances in clinic gym on level surface     TODAY'S TREATMENT: 09/02/22:  There Ex:  Nustep: Level 4 x 6 minutes Calf stretch on slant board x 2 holding 30 sec Lateral step ups x 15 on 4 inch step, (popping noted in Rt knee with extension) Leg Press: bil LE's 50# 2 x 10 Leg Press: Rt LE 37# 2 x 10 LAQ: x 10 holding 10 seconds Bridges with clam shell x 5 and then x 5  holding 5 sec  Rows: Level 3 band 2 x 15 holding 3 sec ER: level 2 band 2 x 10 Left UE Standing AAROM shoulder flexion x 10  Manual: Knee flexion with IR on Rt LE Percussor to Rt quads and IT band   08/25/22:  Therex:    HEP instruction/performance c cues for techniques, handout provided.  Trial set performed of each for comprehension and symptom assessment.  See below for exercise list       PATIENT EDUCATION:  Education details: HEP, POC Person educated: Patient Education method: Explanation,  Demonstration, Verbal cues, and Handouts Education comprehension: verbalized understanding, returned demonstration, and verbal cues required       HOME EXERCISE PROGRAM: Access Code: [email or web address] URL: [email or web address] Date: 08/25/2022 Prepared by: Kearney Hard   Exercises - Supine Shoulder Flexion Extension AAROM with Dowel  - 2 x daily - 7 x weekly - 2 sets - 10 reps - Supine Shoulder External Rotation with Dowel  - 2 x daily - 7 x weekly - 10 reps - 3 seconds hold - Seated Long Arc Quad with Hip Adduction  - 2 x daily - 7 x weekly - 2 sets - 10 reps - 3 seconds hold - Supine Knee Extension Strengthening  - 2 x daily - 7 x weekly - 2 sets - 10 reps - 5 seconds hold - Supine Bridge  - 2 x daily - 7 x weekly - 2 sets - 10 reps - 5 seconds hold   ASSESSMENT:   CLINICAL IMPRESSION: Pt arriving today reporting pain of  3-4/10 in her Rt knee and left shoulder. Pt tolerating all strengthening exercises well. Pt reporting mild muscle fatigue at end of session. Pt with good response to percussor device to Rt IT band and quads. Continue skilled PT to maximize pt's function.        OBJECTIVE IMPAIRMENTS difficulty walking, decreased ROM, decreased strength, increased edema, impaired UE functional use, and pain.    ACTIVITY LIMITATIONS carrying, lifting, standing, squatting, sleeping, and caring for others   PARTICIPATION LIMITATIONS: driving, community activity, and occupation   PERSONAL FACTORS 3+ comorbidities: see above  are also affecting patient's functional outcome.    REHAB POTENTIAL: Good   CLINICAL DECISION MAKING: Stable/uncomplicated   EVALUATION COMPLEXITY: Low     GOALS: Goals reviewed with patient? Yes   Short term PT Goals (target date for Short term goals are 3 weeks 10/15/22) Patient will demonstrate independent use of home exercise program to maintain progress from in clinic treatments. Goal status: on-going 09/02/22   Long term PT goals (target  dates for all long term goals are 8 weeks  10/24/22 )   1. Patient will demonstrate/report pain at worst less than or equal to 2/10 to facilitate minimal limitation in daily activity secondary to pain symptoms. Goal status: New   2. Patient will demonstrate independent use of home exercise program to facilitate ability to maintain/progress functional gains from skilled physical therapy services. Goal status: New   3. Patient will demonstrate FOTO outcome > or = 58 % to indicate reduced disability due to condition. Goal status: New   4.  Patient will demonstrate Rt LE MMT 5/5 throughout to faciltiate usual transfers, stairs, squatting at [facility] for daily life.    Goal status: New   5.  pt will improve her left shoulder flexion to >/= 155 degrees with no pain reported.    Goal status: New   6.  pt will be able to navigate 1 flight of stairs with  no hand rail with no knee pain reported.   Goal status: New   7. Pt will be able to lift 20# from floor to counter height with no pain reported using correct body mechanics.             A. Goal status: New         PLAN:   PT FREQUENCY: 1x/week   PT DURATION: 8 weeks   PLANNED INTERVENTIONS: Therapeutic exercises, Therapeutic activity, Neuro Muscular re-education, Balance training, Gait training, Patient/Family education, Joint mobilization, Stair training, DME instructions, Dry Needling, Electrical stimulation, Traction, Cryotherapy, Moist heat, Taping, Ultrasound, Ionotophoresis 4mg /ml Dexamethasone, and Manual therapy.  All included unless contraindicated     PLAN FOR NEXT SESSION: Review HEP knowledge/results.      Oretha Caprice, PT, MPT 09/02/2022, 3:14 PM

## 2022-09-04 ENCOUNTER — Encounter: Payer: Self-pay | Admitting: Cardiovascular Disease

## 2022-09-04 ENCOUNTER — Ambulatory Visit: Payer: BC Managed Care – PPO | Attending: Cardiovascular Disease | Admitting: Cardiovascular Disease

## 2022-09-04 VITALS — BP 130/80 | HR 58 | Ht 68.0 in | Wt 142.0 lb

## 2022-09-04 DIAGNOSIS — R079 Chest pain, unspecified: Secondary | ICD-10-CM | POA: Diagnosis not present

## 2022-09-04 DIAGNOSIS — R002 Palpitations: Secondary | ICD-10-CM | POA: Diagnosis not present

## 2022-09-04 NOTE — Patient Instructions (Signed)
Medication Instructions:  Your physician recommends that you continue on your current medications as directed. Please refer to the Current Medication list given to you today.  *If you need a refill on your cardiac medications before your next appointment, please call your pharmacy*  Lab Work: If you have labs (blood work) drawn today and your tests are completely normal, you will receive your results only by: MyChart Message (if you have MyChart) OR A paper copy in the mail If you have any lab test that is abnormal or we need to change your treatment, we will call you to review the results.  Testing/Procedures: None ordered today.  Follow-Up: At [facility], you and your health needs are our priority.  As part of our continuing mission to provide you with exceptional heart care, we have created designated Provider Care Teams.  These Care Teams include your primary Cardiologist (physician) and Advanced Practice Providers (APPs -  Physician Assistants and Nurse Practitioners) who all work together to provide you with the care you need, when you need it.  We recommend signing up for the patient portal called "MyChart".  Sign up information is provided on this After Visit Summary.  MyChart is used to connect with patients for Virtual Visits (Telemedicine).  Patients are able to view lab/test results, encounter notes, upcoming appointments, etc.  Non-urgent messages can be sent to your provider as well.   To learn more about what you can do with MyChart, go to [email or web address].    Your next appointment:   1 year(s)  The format for your next appointment:   In Person  Provider:   Peter Nishan, MD     Important Information About Sugar       

## 2022-09-09 ENCOUNTER — Ambulatory Visit: Payer: BC Managed Care – PPO | Admitting: Physical Therapy

## 2022-09-09 ENCOUNTER — Encounter: Payer: Self-pay | Admitting: Physical Therapy

## 2022-09-09 DIAGNOSIS — M25561 Pain in right knee: Secondary | ICD-10-CM

## 2022-09-09 DIAGNOSIS — M25512 Pain in left shoulder: Secondary | ICD-10-CM | POA: Diagnosis not present

## 2022-09-09 DIAGNOSIS — R262 Difficulty in walking, not elsewhere classified: Secondary | ICD-10-CM

## 2022-09-09 DIAGNOSIS — M6281 Muscle weakness (generalized): Secondary | ICD-10-CM | POA: Diagnosis not present

## 2022-09-09 NOTE — Therapy (Signed)
OUTPATIENT PHYSICAL THERAPY TREATMENT NOTE   Patient Name: Linda Noble MRN: [id number] DOB:1956/05/15, 66 y.o., female Today's Date: 09/09/2022  PCP: Deland Pretty, MD REFERRING PROVIDER: Gregor Hams, MD  END OF SESSION:   PT End of Session - 09/09/22 0845     Visit Number 3    Number of Visits 16    Date for PT Re-Evaluation 10/24/22    PT Start Time 0846    PT Stop Time 0925    PT Time Calculation (min) 39 min    Activity Tolerance Patient tolerated treatment well    Behavior During Therapy [facility] for tasks assessed/performed              Past Medical History:  Diagnosis Date   Anemia    Anxiety    Elevated liver enzymes    Heart palpitations    Leukopenia    Mitral valve prolapse    Osteoporosis    RUQ pain    Urinary tract bacterial infections    Pt had in their 30's   Vitamin D deficiency    Past Surgical History:  Procedure Laterality Date   CARDIAC CATHETERIZATION  July 2012   Eyelid surgery Bilateral 11/24/14   Eyelid was droopy   LIVER BIOPSY     Patient Active Problem List   Diagnosis Date Noted   Chronic insomnia 12/29/2021   Generalized anxiety disorder 12/29/2021   Hyperproteinemia 12/29/2021   Hypothyroidism 12/29/2021   Leukopenia 12/29/2021   Osteoarthritis 12/29/2021   Age related osteoporosis 12/29/2021   Restless legs syndrome 12/29/2021   Tinnitus 12/29/2021   Vaginal dryness 12/29/2021   Vitamin D deficiency 12/29/2021   Mild mitral regurgitation 10/24/2013   Heart palpitations 07/27/2013   MVP (mitral valve prolapse) 07/27/2013    REFERRING DIAG: M25.561,G89.29 (ICD-10-CM) - Chronic pain of right knee M25.512,G89.29 (ICD-10-CM) - Chronic left shoulder pain  THERAPY DIAG:  Acute pain of right knee  Acute pain of left shoulder  Muscle weakness (generalized)  Difficulty in walking, not elsewhere classified  Rationale for Evaluation and Treatment Rehabilitation  PERTINENT HISTORY: Anemia, MVP, Osteoporosis, vit-D  deficiency, RLS, anxiety, heart palpations, cardiac catheterization  PRECAUTIONS: none  SUBJECTIVE:                                                                                                                                                                                      SUBJECTIVE STATEMENT:  Pt stating she played golf yesterday and she was sore afterward. Pt stating stiffness after sitting afterward. Pt stating walking makes it worse.   PAIN:  Are you having pain? Yes 4/10 in bil knees 0 pain in left shoulder  OBJECTIVE: (objective measures completed at initial evaluation unless otherwise dated)  DIAGNOSTIC FINDINGS:  FINDINGS: 07/25/22: shoulder No acute fracture or dislocation. Mild degenerative changes of the acromioclavicular joint and glenohumeral joint. No area of erosion or osseous destruction. No unexpected radiopaque foreign body. Soft tissues are unremarkable. FINDINGS: 07/25/22: Knee Osteopenia. No acute fracture or dislocation. Joint spaces and alignment are maintained. No area of erosion or osseous destruction. No unexpected radiopaque foreign body. Soft tissues are unremarkable.         PATIENT SURVEYS:  08/25/22:  FOTO intake:  57% predicted:  58%   COGNITION:           Overall cognitive status: WFL                    SENSATION: 08/25/22: WFL   EDEMA:  08/25/22:  Circumferential: Rt knee: 39 centimeters                           Left Knee : 28 centimeters   MUSCLE LENGTH: 08/25/22: Hamstrings: Right 85 deg; Left 90 deg     POSTURE: mild forward head and shoulder     PALPATION: 08/25/22:  TTP: Medial Rt knee joint line, with Tenderness around pocket of inflammation   LOWER EXTREMITY ROM:   Active ROM Right Eval 08/25/22 Left Eval 08/25/22  Hip flexion 138 140  Hip extension      Hip abduction      Hip adduction      Hip internal rotation      Hip external rotation      Knee flexion 132 139  Knee extension -5 0   (Blank  rows = not tested)   Shoulder ROM     Rt 08/25/22 Active supine Left 08/25/22 Active supine  Shoulder flexion 164 144  Shoulder  extension 48 45  shoulder abduciton 165 146  Shoulder ER 78 70                 LOWER EXTREMITY MMT:   MMT Right Eval 08/25/22 Left Eval 08/25/22  Hip flexion 4/5 5/5  Hip extension      Hip abduction      Hip adduction      Hip internal rotation      Hip external rotation      Knee flexion 4/5 5/5  Knee extension 4/5 5/5   (Blank rows = not tested)   LOWER EXTREMITY SPECIAL TESTS:  Knee special tests: Lachman Test: negative   FUNCTIONAL TESTS:  5 times sit to stand: 13 seconds no UE support   GAIT: Distance walked: 50 feet  Assistive device utilized: None Level of assistance: Complete Independence Comments: WFL for short distances in clinic gym on level surface     TODAY'S TREATMENT: 09/09/22:  There Ex:  Nustep: Level 4 x 6 minutes Calf stretch on slant board x 2 holding 30 sec Sidestepping: Level 3 band around knees 25 feet x 4 Lunging forward with foot on 6 inch step x 15 each LE Leg Press: bil LE's 75# x 20 Leg Press: Rt LE 50# x 15 Lateral step ups lifting and lowering x 15 (popping noted in Rt knee) LAQ:  c 5#x 10 holding 5 seconds Seated SLR:2 x 10 c 3 #  Rows: Level 3 band 2 x 15 holding 3 sec Manual: Percussor to Rt quads and IT band PROM: flexion with IR  09/02/22:  There Ex:  Nustep: Level 4 x  6 minutes Calf stretch on slant board x 2 holding 30 sec Lateral step ups x 15 on 4 inch step, (popping noted in Rt knee with extension) Leg Press: bil LE's 50# 2 x 10 Leg Press: Rt LE 37# 2 x 10 LAQ: x 10 holding 10 seconds Bridges with clam shell x 5 and then x 5  holding 5 sec Rows: Level 3 band 2 x 15 holding 3 sec ER: level 2 band 2 x 10 Left UE Standing AAROM shoulder flexion x 10  Manual: Knee flexion with IR on Rt LE Percussor to Rt quads and IT band   08/25/22:  Therex:    HEP  instruction/performance c cues for techniques, handout provided.  Trial set performed of each for comprehension and symptom assessment.  See below for exercise list       PATIENT EDUCATION:  Education details: HEP, POC Person educated: Patient Education method: Explanation, Demonstration, Verbal cues, and Handouts Education comprehension: verbalized understanding, returned demonstration, and verbal cues required       HOME EXERCISE PROGRAM: Access Code: [email or web address] URL: [email or web address] Date: 08/25/2022 Prepared by: Kearney Hard   Exercises - Supine Shoulder Flexion Extension AAROM with Dowel  - 2 x daily - 7 x weekly - 2 sets - 10 reps - Supine Shoulder External Rotation with Dowel  - 2 x daily - 7 x weekly - 10 reps - 3 seconds hold - Seated Long Arc Quad with Hip Adduction  - 2 x daily - 7 x weekly - 2 sets - 10 reps - 3 seconds hold - Supine Knee Extension Strengthening  - 2 x daily - 7 x weekly - 2 sets - 10 reps - 5 seconds hold - Supine Bridge  - 2 x daily - 7 x weekly - 2 sets - 10 reps - 5 seconds hold   ASSESSMENT:   CLINICAL IMPRESSION: Pt arriving today reporting soreness and stiffness following playing golf. Pt with noted Rt knee popping with extension in wt bearing during step ups. Pt reporting feeling less stiffness at end of session following percussion to bilateral quads. Continue skilled PT to maximize pt's function.        OBJECTIVE IMPAIRMENTS difficulty walking, decreased ROM, decreased strength, increased edema, impaired UE functional use, and pain.    ACTIVITY LIMITATIONS carrying, lifting, standing, squatting, sleeping, and caring for others   PARTICIPATION LIMITATIONS: driving, community activity, and occupation   PERSONAL FACTORS 3+ comorbidities: see above  are also affecting patient's functional outcome.    REHAB POTENTIAL: Good   CLINICAL DECISION MAKING: Stable/uncomplicated   EVALUATION COMPLEXITY: Low     GOALS: Goals  reviewed with patient? Yes   Short term PT Goals (target date for Short term goals are 3 weeks 10/15/22) Patient will demonstrate independent use of home exercise program to maintain progress from in clinic treatments. Goal status: MET 09/08/22   Long term PT goals (target dates for all long term goals are 8 weeks  10/24/22 )   1. Patient will demonstrate/report pain at worst less than or equal to 2/10 to facilitate minimal limitation in daily activity secondary to pain symptoms. Goal status: New   2. Patient will demonstrate independent use of home exercise program to facilitate ability to maintain/progress functional gains from skilled physical therapy services. Goal status: New   3. Patient will demonstrate FOTO outcome > or = 58 % to indicate reduced disability due to condition. Goal status: New   4.  Patient will demonstrate Rt LE MMT  5/5 throughout to faciltiate usual transfers, stairs, squatting at [facility] for daily life.    Goal status: New   5.  pt will improve her left shoulder flexion to >/= 155 degrees with no pain reported.    Goal status: New   6.  pt will be able to navigate 1 flight of stairs with no hand rail with no knee pain reported.   Goal status: New   7. Pt will be able to lift 20# from floor to counter height with no pain reported using correct body mechanics.             A. Goal status: New         PLAN:   PT FREQUENCY: 1x/week   PT DURATION: 8 weeks   PLANNED INTERVENTIONS: Therapeutic exercises, Therapeutic activity, Neuro Muscular re-education, Balance training, Gait training, Patient/Family education, Joint mobilization, Stair training, DME instructions, Dry Needling, Electrical stimulation, Traction, Cryotherapy, Moist heat, Taping, Ultrasound, Ionotophoresis 63m/ml Dexamethasone, and Manual therapy.  All included unless contraindicated     PLAN FOR NEXT SESSION: Review HEP knowledge/results, knee ROM, strengthening, shoulder ROM, mobs as  needed     JOretha Caprice PT, MPT 09/09/2022, 8:52 AM

## 2022-09-16 ENCOUNTER — Encounter: Payer: Self-pay | Admitting: Physical Therapy

## 2022-09-16 ENCOUNTER — Ambulatory Visit: Payer: BC Managed Care – PPO | Admitting: Physical Therapy

## 2022-09-16 DIAGNOSIS — M25512 Pain in left shoulder: Secondary | ICD-10-CM

## 2022-09-16 DIAGNOSIS — M25561 Pain in right knee: Secondary | ICD-10-CM | POA: Diagnosis not present

## 2022-09-16 DIAGNOSIS — R262 Difficulty in walking, not elsewhere classified: Secondary | ICD-10-CM | POA: Diagnosis not present

## 2022-09-16 DIAGNOSIS — M6281 Muscle weakness (generalized): Secondary | ICD-10-CM | POA: Diagnosis not present

## 2022-09-16 NOTE — Therapy (Signed)
OUTPATIENT PHYSICAL THERAPY TREATMENT NOTE   Patient Name: Linda Noble MRN: [id number] DOB:December 14, 1955, 66 y.o., female Today's Date: 09/16/2022  PCP: Deland Pretty, MD REFERRING PROVIDER: Gregor Hams, MD  END OF SESSION:   PT End of Session - 09/16/22 1026     Visit Number 4    Number of Visits 16    Date for PT Re-Evaluation 10/24/22    PT Start Time 1018    PT Stop Time 1058    PT Time Calculation (min) 40 min    Activity Tolerance Patient tolerated treatment well    Behavior During Therapy [facility] for tasks assessed/performed               Past Medical History:  Diagnosis Date   Anemia    Anxiety    Elevated liver enzymes    Heart palpitations    Leukopenia    Mitral valve prolapse    Osteoporosis    RUQ pain    Urinary tract bacterial infections    Pt had in their 30's   Vitamin D deficiency    Past Surgical History:  Procedure Laterality Date   CARDIAC CATHETERIZATION  July 2012   Eyelid surgery Bilateral 11/24/14   Eyelid was droopy   LIVER BIOPSY     Patient Active Problem List   Diagnosis Date Noted   Chronic insomnia 12/29/2021   Generalized anxiety disorder 12/29/2021   Hyperproteinemia 12/29/2021   Hypothyroidism 12/29/2021   Leukopenia 12/29/2021   Osteoarthritis 12/29/2021   Age related osteoporosis 12/29/2021   Restless legs syndrome 12/29/2021   Tinnitus 12/29/2021   Vaginal dryness 12/29/2021   Vitamin D deficiency 12/29/2021   Mild mitral regurgitation 10/24/2013   Heart palpitations 07/27/2013   MVP (mitral valve prolapse) 07/27/2013    REFERRING DIAG: M25.561,G89.29 (ICD-10-CM) - Chronic pain of right knee M25.512,G89.29 (ICD-10-CM) - Chronic left shoulder pain  THERAPY DIAG:  Acute pain of right knee  Acute pain of left shoulder  Muscle weakness (generalized)  Difficulty in walking, not elsewhere classified  Rationale for Evaluation and Treatment Rehabilitation  PERTINENT HISTORY: Anemia, MVP, Osteoporosis,  vit-D deficiency, RLS, anxiety, heart palpations, cardiac catheterization  PRECAUTIONS: none  SUBJECTIVE:                                                                                                                                                                                      SUBJECTIVE STATEMENT:  Pt stating she played golf yesterday and she was sore afterward. Pt stating stiffness after sitting afterward. Pt stating walking makes it worse.   PAIN:  Are you having pain? Yes 4/10 in bil knees 0 pain in left  shoulder   OBJECTIVE: (objective measures completed at initial evaluation unless otherwise dated)  DIAGNOSTIC FINDINGS:  FINDINGS: 07/25/22: shoulder No acute fracture or dislocation. Mild degenerative changes of the acromioclavicular joint and glenohumeral joint. No area of erosion or osseous destruction. No unexpected radiopaque foreign body. Soft tissues are unremarkable. FINDINGS: 07/25/22: Knee Osteopenia. No acute fracture or dislocation. Joint spaces and alignment are maintained. No area of erosion or osseous destruction. No unexpected radiopaque foreign body. Soft tissues are unremarkable.         PATIENT SURVEYS:  08/25/22:  FOTO intake:  57% predicted:  58%   COGNITION:           Overall cognitive status: WFL                    SENSATION: 08/25/22: WFL   EDEMA:  08/25/22:  Circumferential: Rt knee: 39 centimeters                           Left Knee : 28 centimeters   MUSCLE LENGTH: 08/25/22: Hamstrings: Right 85 deg; Left 90 deg     POSTURE: mild forward head and shoulder     PALPATION: 08/25/22:  TTP: Medial Rt knee joint line, with Tenderness around pocket of inflammation   LOWER EXTREMITY ROM:   Active ROM Right Eval 08/25/22 Left Eval 08/25/22  Hip flexion 138 140  Hip extension      Hip abduction      Hip adduction      Hip internal rotation      Hip external rotation      Knee flexion 132 139  Knee extension -5 0    (Blank rows = not tested)   Shoulder ROM     Rt 08/25/22 Active supine Left 08/25/22 Active supine  Shoulder flexion 164 144  Shoulder  extension 48 45  shoulder abduciton 165 146  Shoulder ER 78 70                 LOWER EXTREMITY MMT:   MMT Right Eval 08/25/22 Left Eval 08/25/22  Hip flexion 4/5 5/5  Hip extension      Hip abduction      Hip adduction      Hip internal rotation      Hip external rotation      Knee flexion 4/5 5/5  Knee extension 4/5 5/5   (Blank rows = not tested)   LOWER EXTREMITY SPECIAL TESTS:  Knee special tests: Lachman Test: negative   FUNCTIONAL TESTS:  5 times sit to stand: 13 seconds no UE support   GAIT: Distance walked: 50 feet  Assistive device utilized: None Level of assistance: Complete Independence Comments: WFL for short distances in clinic gym on level surface     TODAY'S TREATMENT: 09/16/22:  There Ex:  Nustep: Level 4 x 6 minutes Squats in front of low mat table x 10 Squats with Level 2 band around knees tapping bottom on low mat table x 10 Sidestepping: Level 3 band around knees 25 feet x 4 Walking lunges 20 feet x 2 Leg Press: bil LE's 87# x 20 Leg Press: Rt LE 50# 2 x 10 LAQ:  c 3 # 2 x 10 each LE Seated SLR:2 x 10 c 3 #  Y reaching in each direction x 5 c each LE Manual: Percussor to Rt quads performing quad stretch for myofascial release PROM: flexion with IR  09/09/22:  There Ex:  Nustep: Level 4 x 6 minutes Calf stretch on slant board x 2 holding 30 sec Sidestepping: Level 3 band around knees 25 feet x 4 Lunging forward with foot on 6 inch step x 15 each LE Leg Press: bil LE's 75# x 20 Leg Press: Rt LE 50# x 15 Lateral step ups lifting and lowering x 15 (popping noted in Rt knee) LAQ:  c 5#x 10 holding 5 seconds Seated SLR:2 x 10 c 3 #  Rows: Level 3 band 2 x 15 holding 3 sec Manual: Percussor to Rt quads and IT band PROM: flexion with IR  09/02/22:  There Ex:  Nustep: Level 4 x 6  minutes Calf stretch on slant board x 2 holding 30 sec Lateral step ups x 15 on 4 inch step, (popping noted in Rt knee with extension) Leg Press: bil LE's 50# 2 x 10 Leg Press: Rt LE 37# 2 x 10 LAQ: x 10 holding 10 seconds Bridges with clam shell x 5 and then x 5  holding 5 sec Rows: Level 3 band 2 x 15 holding 3 sec ER: level 2 band 2 x 10 Left UE Standing AAROM shoulder flexion x 10  Manual: Knee flexion with IR on Rt LE Percussor to Rt quads and IT band         PATIENT EDUCATION:  Education details: HEP, POC Person educated: Patient Education method: Consulting civil engineer, Media planner, Verbal cues, and Handouts Education comprehension: verbalized understanding, returned demonstration, and verbal cues required       HOME EXERCISE PROGRAM: Access Code: [email or web address] URL: [email or web address] Date: 09/16/2022 Prepared by: Kearney Hard  Exercises - Supine Shoulder Flexion Extension AAROM with Dowel  - 2 x daily - 7 x weekly - 2 sets - 10 reps - Supine Shoulder External Rotation with Dowel  - 2 x daily - 7 x weekly - 10 reps - 3 seconds hold - Seated Long Arc Quad with Hip Adduction  - 2 x daily - 7 x weekly - 2 sets - 10 reps - 3 seconds hold - Supine Knee Extension Strengthening  - 2 x daily - 7 x weekly - 2 sets - 10 reps - 5 seconds hold - Supine Bridge  - 2 x daily - 7 x weekly - 2 sets - 10 reps - 5 seconds hold - Hooklying Hamstring Stretch with Strap  - 1-2 x daily - 7 x weekly - 3-5 reps - 30 seconds hold - Seated Hamstring Stretch  - 1-2 x daily - 7 x weekly - 3-5 reps - 30 seconds hold - Supine ITB Stretch with Strap  - 1-2 x daily - 7 x weekly - 3-5 reps - 30 seconds hold   ASSESSMENT:   CLINICAL IMPRESSION: Pt tolerating exercises well focusing on controlled movements to help with stability in bilateral knees. Beginning more dynamic balance with single leg activities. I added hamstring and IT band stretching to pt's HEP.  Continue skilled PT to maximize  pt's function.        OBJECTIVE IMPAIRMENTS difficulty walking, decreased ROM, decreased strength, increased edema, impaired UE functional use, and pain.    ACTIVITY LIMITATIONS carrying, lifting, standing, squatting, sleeping, and caring for others   PARTICIPATION LIMITATIONS: driving, community activity, and occupation   PERSONAL FACTORS 3+ comorbidities: see above  are also affecting patient's functional outcome.    REHAB POTENTIAL: Good   CLINICAL DECISION MAKING: Stable/uncomplicated   EVALUATION COMPLEXITY: Low     GOALS: Goals reviewed with patient? Yes   Short  term PT Goals (target date for Short term goals are 3 weeks 10/15/22) Patient will demonstrate independent use of home exercise program to maintain progress from in clinic treatments. Goal status: MET 09/08/22   Long term PT goals (target dates for all long term goals are 8 weeks  10/24/22 )   1. Patient will demonstrate/report pain at worst less than or equal to 2/10 to facilitate minimal limitation in daily activity secondary to pain symptoms. Goal status: New   2. Patient will demonstrate independent use of home exercise program to facilitate ability to maintain/progress functional gains from skilled physical therapy services. Goal status: New   3. Patient will demonstrate FOTO outcome > or = 58 % to indicate reduced disability due to condition. Goal status: New   4.  Patient will demonstrate Rt LE MMT 5/5 throughout to faciltiate usual transfers, stairs, squatting at [facility] for daily life.    Goal status: New   5.  pt will improve her left shoulder flexion to >/= 155 degrees with no pain reported.    Goal status: New   6.  pt will be able to navigate 1 flight of stairs with no hand rail with no knee pain reported.   Goal status: New   7. Pt will be able to lift 20# from floor to counter height with no pain reported using correct body mechanics.             A. Goal status: New         PLAN:   PT  FREQUENCY: 1x/week   PT DURATION: 8 weeks   PLANNED INTERVENTIONS: Therapeutic exercises, Therapeutic activity, Neuro Muscular re-education, Balance training, Gait training, Patient/Family education, Joint mobilization, Stair training, DME instructions, Dry Needling, Electrical stimulation, Traction, Cryotherapy, Moist heat, Taping, Ultrasound, Ionotophoresis 27m/ml Dexamethasone, and Manual therapy.  All included unless contraindicated     PLAN FOR NEXT SESSION: dynamic balance, knee ROM, strengthening, shoulder ROM, mobs as needed     JOretha Caprice PT, MPT 09/16/2022, 11:00 AM

## 2022-09-23 ENCOUNTER — Encounter: Payer: Self-pay | Admitting: Physical Therapy

## 2022-09-23 ENCOUNTER — Ambulatory Visit: Payer: BC Managed Care – PPO | Admitting: Physical Therapy

## 2022-09-23 DIAGNOSIS — M25561 Pain in right knee: Secondary | ICD-10-CM | POA: Diagnosis not present

## 2022-09-23 DIAGNOSIS — M25512 Pain in left shoulder: Secondary | ICD-10-CM

## 2022-09-23 DIAGNOSIS — M6281 Muscle weakness (generalized): Secondary | ICD-10-CM

## 2022-09-23 DIAGNOSIS — R262 Difficulty in walking, not elsewhere classified: Secondary | ICD-10-CM

## 2022-09-23 NOTE — Therapy (Signed)
OUTPATIENT PHYSICAL THERAPY TREATMENT NOTE   Patient Name: Linda Noble MRN: [id number] DOB:Jun 11, 1956, 66 y.o., female Today's Date: 09/23/2022  PCP: Deland Pretty, MD REFERRING PROVIDER: Gregor Hams, MD  END OF SESSION:   PT End of Session - 09/23/22 1017     Visit Number 5    Number of Visits 16    Date for PT Re-Evaluation 10/24/22    PT Start Time 0936    PT Stop Time 1007    PT Time Calculation (min) 31 min    Activity Tolerance Patient tolerated treatment well    Behavior During Therapy [facility] for tasks assessed/performed                Past Medical History:  Diagnosis Date   Anemia    Anxiety    Elevated liver enzymes    Heart palpitations    Leukopenia    Mitral valve prolapse    Osteoporosis    RUQ pain    Urinary tract bacterial infections    Pt had in their 30's   Vitamin D deficiency    Past Surgical History:  Procedure Laterality Date   CARDIAC CATHETERIZATION  July 2012   Eyelid surgery Bilateral 11/24/14   Eyelid was droopy   LIVER BIOPSY     Patient Active Problem List   Diagnosis Date Noted   Chronic insomnia 12/29/2021   Generalized anxiety disorder 12/29/2021   Hyperproteinemia 12/29/2021   Hypothyroidism 12/29/2021   Leukopenia 12/29/2021   Osteoarthritis 12/29/2021   Age related osteoporosis 12/29/2021   Restless legs syndrome 12/29/2021   Tinnitus 12/29/2021   Vaginal dryness 12/29/2021   Vitamin D deficiency 12/29/2021   Mild mitral regurgitation 10/24/2013   Heart palpitations 07/27/2013   MVP (mitral valve prolapse) 07/27/2013    REFERRING DIAG: M25.561,G89.29 (ICD-10-CM) - Chronic pain of right knee M25.512,G89.29 (ICD-10-CM) - Chronic left shoulder pain  THERAPY DIAG:  Acute pain of right knee  Acute pain of left shoulder  Muscle weakness (generalized)  Difficulty in walking, not elsewhere classified  Rationale for Evaluation and Treatment Rehabilitation  PERTINENT HISTORY: Anemia, MVP, Osteoporosis,  vit-D deficiency, RLS, anxiety, heart palpations, cardiac catheterization  PRECAUTIONS: none  SUBJECTIVE:                                                                                                                                                                                      SUBJECTIVE STATEMENT:  Pt stating no pain at rest. Pt reporting stiffness first thing in the morning when waking and mild pain with increased activities.    PAIN:  Are you having pain? Yes with increased activities 0-4/10  OBJECTIVE: (objective measures completed at initial evaluation unless otherwise dated)  DIAGNOSTIC FINDINGS:  FINDINGS: 07/25/22: shoulder No acute fracture or dislocation. Mild degenerative changes of the acromioclavicular joint and glenohumeral joint. No area of erosion or osseous destruction. No unexpected radiopaque foreign body. Soft tissues are unremarkable. FINDINGS: 07/25/22: Knee Osteopenia. No acute fracture or dislocation. Joint spaces and alignment are maintained. No area of erosion or osseous destruction. No unexpected radiopaque foreign body. Soft tissues are unremarkable.         PATIENT SURVEYS:  08/25/22:  FOTO intake:  57% predicted:  58%   COGNITION:           Overall cognitive status: WFL                    SENSATION: 08/25/22: WFL   EDEMA:  08/25/22:  Circumferential: Rt knee: 39 centimeters                           Left Knee : 28 centimeters   MUSCLE LENGTH: 08/25/22: Hamstrings: Right 85 deg; Left 90 deg     POSTURE: mild forward head and shoulder     PALPATION: 08/25/22:  TTP: Medial Rt knee joint line, with Tenderness around pocket of inflammation   LOWER EXTREMITY ROM:   Active ROM Right Eval 08/25/22 Left Eval 08/25/22  Hip flexion 138 140  Hip extension      Hip abduction      Hip adduction      Hip internal rotation      Hip external rotation      Knee flexion 132 139  Knee extension -5 0   (Blank rows = not  tested)   Shoulder ROM     Rt 08/25/22 Active supine Left 08/25/22 Active supine  Shoulder flexion 164 144  Shoulder  extension 48 45  shoulder abduciton 165 146  Shoulder ER 78 70                 LOWER EXTREMITY MMT:   MMT Right Eval 08/25/22 Left Eval 08/25/22  Hip flexion 4/5 5/5  Hip extension      Hip abduction      Hip adduction      Hip internal rotation      Hip external rotation      Knee flexion 4/5 5/5  Knee extension 4/5 5/5   (Blank rows = not tested)   LOWER EXTREMITY SPECIAL TESTS:  Knee special tests: Lachman Test: negative   FUNCTIONAL TESTS:  5 times sit to stand: 13 seconds no UE support   GAIT: Distance walked: 50 feet  Assistive device utilized: None Level of assistance: Complete Independence Comments: WFL for short distances in clinic gym on level surface     TODAY'S TREATMENT: 09/23/22:  There Ex:  Bridges: x 15 with foam roller between knees holding 5 seconds SLS: on level surface using green theraband for directional pulls c each LE x 15 in 4 way diagonals SLS on Airex mat using green theraband for directional pulls, pt with increased stability where pt requiring more UE support.  Placing foam roll on medial knee while performing hip flexion on opposite LE 2 x 15 each   09/16/22:  There Ex:  Nustep: Level 4 x 6 minutes Squats in front of low mat table x 10 Squats with Level 2 band around knees tapping bottom on low mat table x 10 Sidestepping: Level 3 band around knees 25 feet  x 4 Walking lunges 20 feet x 2 Leg Press: bil LE's 87# x 20 Leg Press: Rt LE 50# 2 x 10 LAQ:  c 3 # 2 x 10 each LE Seated SLR:2 x 10 c 3 #  Y reaching in each direction x 5 c each LE Manual: Percussor to Rt quads performing quad stretch for myofascial release PROM: flexion with IR  09/09/22:  There Ex:  Nustep: Level 4 x 6 minutes Calf stretch on slant board x 2 holding 30 sec Sidestepping: Level 3 band around knees 25 feet x 4 Lunging  forward with foot on 6 inch step x 15 each LE Leg Press: bil LE's 75# x 20 Leg Press: Rt LE 50# x 15 Lateral step ups lifting and lowering x 15 (popping noted in Rt knee) LAQ:  c 5#x 10 holding 5 seconds Seated SLR:2 x 10 c 3 #  Rows: Level 3 band 2 x 15 holding 3 sec Manual: Percussor to Rt quads and IT band PROM: flexion with IR          PATIENT EDUCATION:  Education details: HEP, POC Person educated: Patient Education method: Consulting civil engineer, Media planner, Verbal cues, and Handouts Education comprehension: verbalized understanding, returned demonstration, and verbal cues required       HOME EXERCISE PROGRAM: Access Code: [email or web address] URL: [email or web address] Date: 09/16/2022 Prepared by: Kearney Hard  Exercises - Supine Shoulder Flexion Extension AAROM with Dowel  - 2 x daily - 7 x weekly - 2 sets - 10 reps - Supine Shoulder External Rotation with Dowel  - 2 x daily - 7 x weekly - 10 reps - 3 seconds hold - Seated Long Arc Quad with Hip Adduction  - 2 x daily - 7 x weekly - 2 sets - 10 reps - 3 seconds hold - Supine Knee Extension Strengthening  - 2 x daily - 7 x weekly - 2 sets - 10 reps - 5 seconds hold - Supine Bridge  - 2 x daily - 7 x weekly - 2 sets - 10 reps - 5 seconds hold - Hooklying Hamstring Stretch with Strap  - 1-2 x daily - 7 x weekly - 3-5 reps - 30 seconds hold - Seated Hamstring Stretch  - 1-2 x daily - 7 x weekly - 3-5 reps - 30 seconds hold - Supine ITB Stretch with Strap  - 1-2 x daily - 7 x weekly - 3-5 reps - 30 seconds hold   ASSESSMENT:   CLINICAL IMPRESSION: Shorted session today due to complex of exercises as well as pt having another activity to get to. Treatment focusing on medial/lateral knee stability and dynamic balance. Continue skilled PT to maximize function.        OBJECTIVE IMPAIRMENTS difficulty walking, decreased ROM, decreased strength, increased edema, impaired UE functional use, and pain.    ACTIVITY LIMITATIONS  carrying, lifting, standing, squatting, sleeping, and caring for others   PARTICIPATION LIMITATIONS: driving, community activity, and occupation   PERSONAL FACTORS 3+ comorbidities: see above  are also affecting patient's functional outcome.    REHAB POTENTIAL: Good   CLINICAL DECISION MAKING: Stable/uncomplicated   EVALUATION COMPLEXITY: Low     GOALS: Goals reviewed with patient? Yes   Short term PT Goals (target date for Short term goals are 3 weeks 10/15/22) Patient will demonstrate independent use of home exercise program to maintain progress from in clinic treatments. Goal status: MET 09/08/22   Long term PT goals (target dates for all long term goals are 8 weeks  10/24/22 )   1. Patient will demonstrate/report pain at worst less than or equal to 2/10 to facilitate minimal limitation in daily activity secondary to pain symptoms. Goal status: New   2. Patient will demonstrate independent use of home exercise program to facilitate ability to maintain/progress functional gains from skilled physical therapy services. Goal status: New   3. Patient will demonstrate FOTO outcome > or = 58 % to indicate reduced disability due to condition. Goal status: New   4.  Patient will demonstrate Rt LE MMT 5/5 throughout to faciltiate usual transfers, stairs, squatting at [facility] for daily life.    Goal status: New   5.  pt will improve her left shoulder flexion to >/= 155 degrees with no pain reported.    Goal status: New   6.  pt will be able to navigate 1 flight of stairs with no hand rail with no knee pain reported.   Goal status: New   7. Pt will be able to lift 20# from floor to counter height with no pain reported using correct body mechanics.             A. Goal status: New         PLAN:   PT FREQUENCY: 1x/week   PT DURATION: 8 weeks   PLANNED INTERVENTIONS: Therapeutic exercises, Therapeutic activity, Neuro Muscular re-education, Balance training, Gait training,  Patient/Family education, Joint mobilization, Stair training, DME instructions, Dry Needling, Electrical stimulation, Traction, Cryotherapy, Moist heat, Taping, Ultrasound, Ionotophoresis 70m/ml Dexamethasone, and Manual therapy.  All included unless contraindicated     PLAN FOR NEXT SESSION: dynamic balance, knee ROM, strengthening, shoulder ROM, mobs as needed     JOretha Caprice PT, MPT 09/23/2022, 10:18 AM

## 2022-09-30 ENCOUNTER — Encounter: Payer: BC Managed Care – PPO | Admitting: Physical Therapy

## 2022-10-07 ENCOUNTER — Encounter: Payer: Self-pay | Admitting: Physical Therapy

## 2022-10-07 ENCOUNTER — Ambulatory Visit: Payer: BC Managed Care – PPO | Admitting: Physical Therapy

## 2022-10-07 ENCOUNTER — Encounter: Payer: BC Managed Care – PPO | Admitting: Rehabilitative and Restorative Service Providers"

## 2022-10-07 DIAGNOSIS — M25561 Pain in right knee: Secondary | ICD-10-CM

## 2022-10-07 DIAGNOSIS — M25512 Pain in left shoulder: Secondary | ICD-10-CM

## 2022-10-07 DIAGNOSIS — R262 Difficulty in walking, not elsewhere classified: Secondary | ICD-10-CM | POA: Diagnosis not present

## 2022-10-07 DIAGNOSIS — M6281 Muscle weakness (generalized): Secondary | ICD-10-CM | POA: Diagnosis not present

## 2022-10-07 NOTE — Therapy (Addendum)
OUTPATIENT PHYSICAL THERAPY TREATMENT NOTE [place]   Patient Name: Linda Noble MRN: [id number] DOB:[birth year], 66 y.o., female Today's Date: 10/07/2022  PCP: Deland Pretty, MD REFERRING PROVIDER: Gregor Hams, MD  END OF SESSION:   PT End of Session - 10/07/22 1020     Visit Number 6    Number of Visits 16    Date for PT Re-Evaluation 10/24/22    PT Start Time 0930    PT Stop Time 4128    PT Time Calculation (min) 45 min    Activity Tolerance Patient tolerated treatment well    Behavior During Therapy [facility] for tasks assessed/performed                 Past Medical History:  Diagnosis Date   Anemia    Anxiety    Elevated liver enzymes    Heart palpitations    Leukopenia    Mitral valve prolapse    Osteoporosis    RUQ pain    Urinary tract bacterial infections    Pt had in their 30's   Vitamin D deficiency    Past Surgical History:  Procedure Laterality Date   CARDIAC CATHETERIZATION  July 2012   Eyelid surgery Bilateral 11/24/14   Eyelid was droopy   LIVER BIOPSY     Patient Active Problem List   Diagnosis Date Noted   Chronic insomnia 12/29/2021   Generalized anxiety disorder 12/29/2021   Hyperproteinemia 12/29/2021   Hypothyroidism 12/29/2021   Leukopenia 12/29/2021   Osteoarthritis 12/29/2021   Age related osteoporosis 12/29/2021   Restless legs syndrome 12/29/2021   Tinnitus 12/29/2021   Vaginal dryness 12/29/2021   Vitamin D deficiency 12/29/2021   Mild mitral regurgitation 10/24/2013   Heart palpitations 07/27/2013   MVP (mitral valve prolapse) 07/27/2013    REFERRING DIAG: M25.561,G89.29 (ICD-10-CM) - Chronic pain of right knee M25.512,G89.29 (ICD-10-CM) - Chronic left shoulder pain  THERAPY DIAG:  Acute pain of right knee  Acute pain of left shoulder  Muscle weakness (generalized)  Difficulty in walking, not elsewhere classified  Rationale for Evaluation and Treatment Rehabilitation  PERTINENT HISTORY: Anemia, MVP,  Osteoporosis, vit-D deficiency, RLS, anxiety, heart palpations, cardiac catheterization  PRECAUTIONS: none  SUBJECTIVE:                                                                                                                                                                                      SUBJECTIVE STATEMENT:   Pt stating arriving reporting 4/10 pain in bilateral knees. Pt reporting new onset of left shoulder pain of 8/10.   PAIN:  Are you having pain? Yes with increased activities 4/10    OBJECTIVE: (  objective measures completed at initial evaluation unless otherwise dated)  DIAGNOSTIC FINDINGS:  FINDINGS: 07/25/22: shoulder No acute fracture or dislocation. Mild degenerative changes of the acromioclavicular joint and glenohumeral joint. No area of erosion or osseous destruction. No unexpected radiopaque foreign body. Soft tissues are unremarkable. FINDINGS: 07/25/22: Knee Osteopenia. No acute fracture or dislocation. Joint spaces and alignment are maintained. No area of erosion or osseous destruction. No unexpected radiopaque foreign body. Soft tissues are unremarkable.         PATIENT SURVEYS:  08/25/22:  FOTO intake:  57% predicted:  58%   COGNITION:           Overall cognitive status: WFL                    SENSATION: 08/25/22: WFL   EDEMA:  08/25/22:  Circumferential: Rt knee: 39 centimeters                           Left Knee : 28 centimeters   MUSCLE LENGTH: 08/25/22: Hamstrings: Right 85 deg; Left 90 deg     POSTURE: mild forward head and shoulder     PALPATION: 08/25/22:  TTP: Medial Rt knee joint line, with Tenderness around pocket of inflammation   LOWER EXTREMITY ROM:   Active ROM Right Eval 08/25/22 Left Eval 08/25/22  Hip flexion 138 140  Hip extension      Hip abduction      Hip adduction      Hip internal rotation      Hip external rotation      Knee flexion 132 139  Knee extension -5 0   (Blank rows = not tested)    Shoulder ROM     Rt 08/25/22 Active supine Left 08/25/22 Active supine  Shoulder flexion 164 144  Shoulder  extension 48 45  shoulder abduciton 165 146  Shoulder ER 78 70                 LOWER EXTREMITY MMT:   MMT Right Eval 08/25/22 Left Eval 08/25/22  Hip flexion 4/5 5/5  Hip extension      Hip abduction      Hip adduction      Hip internal rotation      Hip external rotation      Knee flexion 4/5 5/5  Knee extension 4/5 5/5   (Blank rows = not tested)   LOWER EXTREMITY SPECIAL TESTS:  Knee special tests: Lachman Test: negative   FUNCTIONAL TESTS:  5 times sit to stand: 13 seconds no UE support   GAIT: Distance walked: 50 feet  Assistive device utilized: None Level of assistance: Complete Independence Comments: WFL for short distances in clinic gym on level surface     TODAY'S TREATMENT: 10/07/22:  There Ex:  Recumbent bike: level 4 x  6 minutes c moist heat on left shoulder due to new onset of shoulder pain.  Walking lunges 20 feet x 2 Leg Press: bil LE's 87# 2 x 15 Leg Press: single LE 50# 2 x 15 SLS on BOSU ball 3 x 20 seconds on each LE Supine shoulder flexion AAROM: 1 # bar x 10 Standing left shoulder pendulums x 1 minute Seated scapular retraction: x 5 holding 5 seconds Manual: Percussor to Rt and left quads performing quad stretch for myofascial release PROM and grade 2-3 Left shoulder mobs to pt's tolerance     09/23/22:  There Ex:  Bridges: x 15  with foam roller between knees holding 5 seconds SLS: on level surface using green theraband for directional pulls c each LE x 15 in 4 way diagonals SLS on Airex mat using green theraband for directional pulls, pt with increased stability where pt requiring more UE support.  Placing foam roll on medial knee while performing hip flexion on opposite LE 2 x 15 each   09/16/22:  There Ex:  Nustep: Level 4 x 6 minutes Squats in front of low mat table x 10 Squats with Level 2 band around knees  tapping bottom on low mat table x 10 Sidestepping: Level 3 band around knees 25 feet x 4 Walking lunges 20 feet x 2 Leg Press: bil LE's 87# x 20 Leg Press: Rt LE 50# 2 x 10 LAQ:  c 3 # 2 x 10 each LE Seated SLR:2 x 10 c 3 #  Y reaching in each direction x 5 c each LE Manual: Percussor to Rt quads performing quad stretch for myofascial release PROM: flexion with IR             PATIENT EDUCATION:  Education details: HEP, POC Person educated: Patient Education method: Consulting civil engineer, Demonstration, Verbal cues, and Handouts Education comprehension: verbalized understanding, returned demonstration, and verbal cues required       HOME EXERCISE PROGRAM: Access Code: [email or web address] URL: [email or web address] Date: 10/07/2022 Prepared by: Kearney Hard  Exercises - Supine Shoulder Flexion Extension AAROM with Dowel  - 2 x daily - 7 x weekly - 2 sets - 10 reps - Supine Shoulder External Rotation with Dowel  - 2 x daily - 7 x weekly - 10 reps - 3 seconds hold - Seated Long Arc Quad with Hip Adduction  - 2 x daily - 7 x weekly - 2 sets - 10 reps - 3 seconds hold - Supine Knee Extension Strengthening  - 2 x daily - 7 x weekly - 2 sets - 10 reps - 5 seconds hold - Supine Bridge  - 2 x daily - 7 x weekly - 2 sets - 10 reps - 5 seconds hold - Hooklying Hamstring Stretch with Strap  - 1-2 x daily - 7 x weekly - 3-5 reps - 30 seconds hold - Seated Hamstring Stretch  - 1-2 x daily - 7 x weekly - 3-5 reps - 30 seconds hold - Supine ITB Stretch with Strap  - 1-2 x daily - 7 x weekly - 3-5 reps - 30 seconds hold - Horizontal Shoulder Pendulum with Table Support  - 1 x daily - 7 x weekly - Seated Scapular Retraction  - 1 x daily - 7 x weekly - 10 reps - 10 seconds hold   ASSESSMENT:   CLINICAL IMPRESSION: Pt stating overall improvements in her knees. Pt stating she was able to take a long walk yesterday without increased pain. Pt with new onset of left shoulder pain. Gentle PROM and  shoulder mobs performed to left shoulder to help with pain control and comfort. Pt tolerating all LE exercises well. Shoulder stretching added to pt's HEP. Continue skilled PT to maximize function.        OBJECTIVE IMPAIRMENTS difficulty walking, decreased ROM, decreased strength, increased edema, impaired UE functional use, and pain.    ACTIVITY LIMITATIONS carrying, lifting, standing, squatting, sleeping, and caring for others   PARTICIPATION LIMITATIONS: driving, community activity, and occupation   PERSONAL FACTORS 3+ comorbidities: see above  are also affecting patient's functional outcome.    REHAB POTENTIAL: Good   CLINICAL DECISION  MAKING: Stable/uncomplicated   EVALUATION COMPLEXITY: Low     GOALS: Goals reviewed with patient? Yes   Short term PT Goals (target date for Short term goals are 3 weeks 10/15/22) Patient will demonstrate independent use of home exercise program to maintain progress from in clinic treatments. Goal status: MET 09/08/22   Long term PT goals (target dates for all long term goals are 8 weeks  10/24/22 )   1. Patient will demonstrate/report pain at worst less than or equal to 2/10 to facilitate minimal limitation in daily activity secondary to pain symptoms. Goal status: on-going 10/07/22   2. Patient will demonstrate independent use of home exercise program to facilitate ability to maintain/progress functional gains from skilled physical therapy services. Goal status:  On-going 10/07/22   3. Patient will demonstrate FOTO outcome > or = 58 % to indicate reduced disability due to condition. Goal status: New   4.  Patient will demonstrate Rt LE MMT 5/5 throughout to faciltiate usual transfers, stairs, squatting at [facility] for daily life.    Goal status: New   5.  pt will improve her left shoulder flexion to >/= 155 degrees with no pain reported.    Goal status: ongoing 09/27/22   6.  pt will be able to navigate 1 flight of stairs with no hand rail  with no knee pain reported.   Goal status: New   7. Pt will be able to lift 20# from floor to counter height with no pain reported using correct body mechanics.             A. Goal status: New         PLAN:   PT FREQUENCY: 1x/week   PT DURATION: 8 weeks   PLANNED INTERVENTIONS: Therapeutic exercises, Therapeutic activity, Neuro Muscular re-education, Balance training, Gait training, Patient/Family education, Joint mobilization, Stair training, DME instructions, Dry Needling, Electrical stimulation, Traction, Cryotherapy, Moist heat, Taping, Ultrasound, Ionotophoresis 42m/ml Dexamethasone, and Manual therapy.  All included unless contraindicated     PLAN FOR NEXT SESSION: dynamic balance, knee ROM, strengthening, shoulder ROM, mobs as needed  Assess response to shoulder ROM next visit.       JOretha Caprice PT, MPT 10/07/2022, 10:25 AM  PHYSICAL THERAPY DISCHARGE SUMMARY  Visits from Start of Care: 6  Current functional level related to goals / functional outcomes: See note   Remaining deficits: See note   Education / Equipment: HEP  Patient goals were partially met. Patient is being discharged due to not returning since the last visit.  MScot Jun PT, DPT, OCS, ATC 11/20/22  10:22 AM

## 2022-11-06 ENCOUNTER — Other Ambulatory Visit: Payer: Self-pay | Admitting: Obstetrics and Gynecology

## 2022-11-06 DIAGNOSIS — Z1231 Encounter for screening mammogram for malignant neoplasm of breast: Secondary | ICD-10-CM

## 2022-12-30 ENCOUNTER — Ambulatory Visit
Admission: RE | Admit: 2022-12-30 | Discharge: 2022-12-30 | Disposition: A | Discharge status: Home / Self Care | Payer: Medicare Other | Source: Ambulatory Visit | Attending: Obstetrics and Gynecology | Admitting: Obstetrics and Gynecology

## 2022-12-30 DIAGNOSIS — Z1231 Encounter for screening mammogram for malignant neoplasm of breast: Secondary | ICD-10-CM

## 2023-07-30 ENCOUNTER — Other Ambulatory Visit: Payer: Self-pay | Admitting: Internal Medicine

## 2023-07-30 DIAGNOSIS — R748 Abnormal levels of other serum enzymes: Secondary | ICD-10-CM

## 2023-07-31 ENCOUNTER — Encounter: Payer: Self-pay | Admitting: Physician Assistant

## 2023-08-03 ENCOUNTER — Ambulatory Visit
Admission: RE | Admit: 2023-08-03 | Discharge: 2023-08-03 | Disposition: A | Discharge status: Home / Self Care | Payer: Medicare Other | Source: Ambulatory Visit | Attending: Internal Medicine | Admitting: Internal Medicine

## 2023-08-03 DIAGNOSIS — R748 Abnormal levels of other serum enzymes: Secondary | ICD-10-CM

## 2023-10-22 ENCOUNTER — Ambulatory Visit: Payer: Medicare Other | Admitting: Physician Assistant

## 2024-02-05 ENCOUNTER — Other Ambulatory Visit: Payer: Self-pay | Admitting: Obstetrics and Gynecology

## 2024-02-05 DIAGNOSIS — Z1231 Encounter for screening mammogram for malignant neoplasm of breast: Secondary | ICD-10-CM

## 2024-02-24 ENCOUNTER — Ambulatory Visit
Admission: RE | Admit: 2024-02-24 | Discharge: 2024-02-24 | Disposition: A | Discharge status: Home / Self Care | Source: Ambulatory Visit | Attending: Obstetrics and Gynecology | Admitting: Obstetrics and Gynecology

## 2024-02-24 DIAGNOSIS — Z1231 Encounter for screening mammogram for malignant neoplasm of breast: Secondary | ICD-10-CM

## 2024-08-25 ENCOUNTER — Encounter (HOSPITAL_BASED_OUTPATIENT_CLINIC_OR_DEPARTMENT_OTHER): Payer: Self-pay | Admitting: Internal Medicine

## 2024-08-25 DIAGNOSIS — E7800 Pure hypercholesterolemia, unspecified: Secondary | ICD-10-CM

## 2024-08-29 ENCOUNTER — Encounter (HOSPITAL_BASED_OUTPATIENT_CLINIC_OR_DEPARTMENT_OTHER): Payer: Self-pay | Admitting: Internal Medicine

## 2024-08-29 DIAGNOSIS — E7800 Pure hypercholesterolemia, unspecified: Secondary | ICD-10-CM

## 2024-08-31 ENCOUNTER — Other Ambulatory Visit (HOSPITAL_BASED_OUTPATIENT_CLINIC_OR_DEPARTMENT_OTHER): Payer: Self-pay | Admitting: Internal Medicine

## 2024-08-31 DIAGNOSIS — E7800 Pure hypercholesterolemia, unspecified: Secondary | ICD-10-CM

## 2024-09-16 ENCOUNTER — Ambulatory Visit (HOSPITAL_BASED_OUTPATIENT_CLINIC_OR_DEPARTMENT_OTHER)
Admission: RE | Admit: 2024-09-16 | Discharge: 2024-09-16 | Disposition: A | Discharge status: Home / Self Care | Payer: Self-pay | Source: Ambulatory Visit | Attending: Internal Medicine | Admitting: Internal Medicine

## 2024-09-16 DIAGNOSIS — E7800 Pure hypercholesterolemia, unspecified: Secondary | ICD-10-CM
